# Patient Record
Sex: Female | Born: 1995
Health system: Southern US, Community
[De-identification: ages and names within clinical notes are randomized; demographics above are authoritative.]

## PROBLEM LIST (undated history)

## (undated) DIAGNOSIS — Q0700 Arnold-Chiari syndrome without spina bifida or hydrocephalus: Secondary | ICD-10-CM

## (undated) DIAGNOSIS — D649 Anemia, unspecified: Secondary | ICD-10-CM

## (undated) DIAGNOSIS — E876 Hypokalemia: Secondary | ICD-10-CM

## (undated) DIAGNOSIS — G43909 Migraine, unspecified, not intractable, without status migrainosus: Secondary | ICD-10-CM

## (undated) DIAGNOSIS — Z8489 Family history of other specified conditions: Secondary | ICD-10-CM

## (undated) DIAGNOSIS — K219 Gastro-esophageal reflux disease without esophagitis: Secondary | ICD-10-CM

## (undated) DIAGNOSIS — I959 Hypotension, unspecified: Secondary | ICD-10-CM

## (undated) DIAGNOSIS — F411 Generalized anxiety disorder: Secondary | ICD-10-CM

## (undated) DIAGNOSIS — F429 Obsessive-compulsive disorder, unspecified: Secondary | ICD-10-CM

## (undated) DIAGNOSIS — F901 Attention-deficit hyperactivity disorder, predominantly hyperactive type: Secondary | ICD-10-CM

## (undated) DIAGNOSIS — F950 Transient tic disorder: Secondary | ICD-10-CM

## (undated) DIAGNOSIS — R55 Syncope and collapse: Secondary | ICD-10-CM

## (undated) DIAGNOSIS — J45909 Unspecified asthma, uncomplicated: Secondary | ICD-10-CM

## (undated) DIAGNOSIS — G935 Compression of brain: Secondary | ICD-10-CM

## (undated) HISTORY — DX: Attention-deficit hyperactivity disorder, predominantly hyperactive type: F90.1

## (undated) HISTORY — PX: TUBAL LIGATION: SHX77

## (undated) HISTORY — PX: TONSILLECTOMY: SUR1361

## (undated) HISTORY — PX: WISDOM TOOTH EXTRACTION: SHX21

## (undated) HISTORY — DX: Compression of brain: G93.5

## (undated) HISTORY — DX: Anemia, unspecified: D64.9

## (undated) HISTORY — DX: Hypotension, unspecified: I95.9

## (undated) HISTORY — PX: SINUS EXPLORATION: SHX5214

---
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
## 2015-07-31 ENCOUNTER — Ambulatory Visit (INDEPENDENT_AMBULATORY_CARE_PROVIDER_SITE_OTHER): Payer: BLUE CROSS/BLUE SHIELD | Admitting: Psychology

## 2015-07-31 DIAGNOSIS — F410 Panic disorder [episodic paroxysmal anxiety] without agoraphobia: Secondary | ICD-10-CM | POA: Diagnosis not present

## 2015-07-31 DIAGNOSIS — F411 Generalized anxiety disorder: Secondary | ICD-10-CM | POA: Diagnosis not present

## 2015-08-21 ENCOUNTER — Ambulatory Visit (INDEPENDENT_AMBULATORY_CARE_PROVIDER_SITE_OTHER): Payer: BLUE CROSS/BLUE SHIELD | Admitting: Psychology

## 2015-08-21 DIAGNOSIS — F411 Generalized anxiety disorder: Secondary | ICD-10-CM | POA: Diagnosis not present

## 2015-08-21 DIAGNOSIS — F410 Panic disorder [episodic paroxysmal anxiety] without agoraphobia: Secondary | ICD-10-CM

## 2015-09-04 ENCOUNTER — Ambulatory Visit (INDEPENDENT_AMBULATORY_CARE_PROVIDER_SITE_OTHER): Payer: BLUE CROSS/BLUE SHIELD | Admitting: Psychology

## 2015-09-04 DIAGNOSIS — F411 Generalized anxiety disorder: Secondary | ICD-10-CM | POA: Diagnosis not present

## 2015-09-04 DIAGNOSIS — F410 Panic disorder [episodic paroxysmal anxiety] without agoraphobia: Secondary | ICD-10-CM

## 2015-09-09 ENCOUNTER — Ambulatory Visit (INDEPENDENT_AMBULATORY_CARE_PROVIDER_SITE_OTHER): Payer: BLUE CROSS/BLUE SHIELD | Admitting: Psychology

## 2015-09-09 DIAGNOSIS — F410 Panic disorder [episodic paroxysmal anxiety] without agoraphobia: Secondary | ICD-10-CM | POA: Diagnosis not present

## 2015-09-09 DIAGNOSIS — F411 Generalized anxiety disorder: Secondary | ICD-10-CM

## 2015-09-25 ENCOUNTER — Ambulatory Visit: Payer: BLUE CROSS/BLUE SHIELD | Admitting: Psychology

## 2015-10-09 ENCOUNTER — Ambulatory Visit (INDEPENDENT_AMBULATORY_CARE_PROVIDER_SITE_OTHER): Payer: BLUE CROSS/BLUE SHIELD | Admitting: Psychology

## 2015-10-09 DIAGNOSIS — F411 Generalized anxiety disorder: Secondary | ICD-10-CM

## 2015-10-14 ENCOUNTER — Ambulatory Visit (INDEPENDENT_AMBULATORY_CARE_PROVIDER_SITE_OTHER): Payer: BLUE CROSS/BLUE SHIELD | Admitting: Psychology

## 2015-10-14 DIAGNOSIS — F411 Generalized anxiety disorder: Secondary | ICD-10-CM | POA: Diagnosis not present

## 2015-10-14 DIAGNOSIS — F8082 Social pragmatic communication disorder: Secondary | ICD-10-CM | POA: Diagnosis not present

## 2015-10-16 ENCOUNTER — Ambulatory Visit (INDEPENDENT_AMBULATORY_CARE_PROVIDER_SITE_OTHER): Payer: BLUE CROSS/BLUE SHIELD | Admitting: Psychology

## 2015-10-16 DIAGNOSIS — F422 Mixed obsessional thoughts and acts: Secondary | ICD-10-CM

## 2015-10-16 DIAGNOSIS — F8082 Social pragmatic communication disorder: Secondary | ICD-10-CM

## 2015-10-30 ENCOUNTER — Ambulatory Visit (INDEPENDENT_AMBULATORY_CARE_PROVIDER_SITE_OTHER): Payer: BLUE CROSS/BLUE SHIELD | Admitting: Psychology

## 2015-10-30 DIAGNOSIS — F8082 Social pragmatic communication disorder: Secondary | ICD-10-CM

## 2015-10-30 DIAGNOSIS — F422 Mixed obsessional thoughts and acts: Secondary | ICD-10-CM

## 2015-11-11 ENCOUNTER — Ambulatory Visit (INDEPENDENT_AMBULATORY_CARE_PROVIDER_SITE_OTHER): Payer: BLUE CROSS/BLUE SHIELD | Admitting: Psychology

## 2015-11-11 DIAGNOSIS — F410 Panic disorder [episodic paroxysmal anxiety] without agoraphobia: Secondary | ICD-10-CM

## 2015-11-11 DIAGNOSIS — F8082 Social pragmatic communication disorder: Secondary | ICD-10-CM

## 2015-11-11 DIAGNOSIS — F422 Mixed obsessional thoughts and acts: Secondary | ICD-10-CM | POA: Diagnosis not present

## 2015-12-16 ENCOUNTER — Ambulatory Visit (INDEPENDENT_AMBULATORY_CARE_PROVIDER_SITE_OTHER): Payer: BLUE CROSS/BLUE SHIELD | Admitting: Psychology

## 2015-12-16 DIAGNOSIS — F422 Mixed obsessional thoughts and acts: Secondary | ICD-10-CM

## 2015-12-16 DIAGNOSIS — F840 Autistic disorder: Secondary | ICD-10-CM | POA: Diagnosis not present

## 2015-12-17 DIAGNOSIS — F950 Transient tic disorder: Secondary | ICD-10-CM | POA: Insufficient documentation

## 2015-12-17 DIAGNOSIS — K589 Irritable bowel syndrome without diarrhea: Secondary | ICD-10-CM | POA: Insufficient documentation

## 2015-12-17 DIAGNOSIS — F3281 Premenstrual dysphoric disorder: Secondary | ICD-10-CM | POA: Insufficient documentation

## 2015-12-17 DIAGNOSIS — L900 Lichen sclerosus et atrophicus: Secondary | ICD-10-CM | POA: Insufficient documentation

## 2016-02-24 ENCOUNTER — Ambulatory Visit (INDEPENDENT_AMBULATORY_CARE_PROVIDER_SITE_OTHER): Payer: BLUE CROSS/BLUE SHIELD | Admitting: Psychology

## 2016-02-24 DIAGNOSIS — F422 Mixed obsessional thoughts and acts: Secondary | ICD-10-CM | POA: Diagnosis not present

## 2016-02-24 DIAGNOSIS — F840 Autistic disorder: Secondary | ICD-10-CM

## 2016-03-09 ENCOUNTER — Ambulatory Visit (INDEPENDENT_AMBULATORY_CARE_PROVIDER_SITE_OTHER): Payer: BLUE CROSS/BLUE SHIELD | Admitting: Psychology

## 2016-03-09 DIAGNOSIS — F422 Mixed obsessional thoughts and acts: Secondary | ICD-10-CM

## 2016-03-09 DIAGNOSIS — F840 Autistic disorder: Secondary | ICD-10-CM | POA: Diagnosis not present

## 2017-07-06 DIAGNOSIS — Q0700 Arnold-Chiari syndrome without spina bifida or hydrocephalus: Secondary | ICD-10-CM | POA: Insufficient documentation

## 2018-07-04 DIAGNOSIS — J189 Pneumonia, unspecified organism: Secondary | ICD-10-CM

## 2018-07-04 HISTORY — DX: Pneumonia, unspecified organism: J18.9

## 2018-07-12 DIAGNOSIS — Z531 Procedure and treatment not carried out because of patient's decision for reasons of belief and group pressure: Secondary | ICD-10-CM | POA: Insufficient documentation

## 2018-07-12 DIAGNOSIS — IMO0001 Reserved for inherently not codable concepts without codable children: Secondary | ICD-10-CM | POA: Insufficient documentation

## 2018-07-13 DIAGNOSIS — Z9851 Tubal ligation status: Secondary | ICD-10-CM | POA: Insufficient documentation

## 2018-07-13 HISTORY — PX: SALPINGECTOMY: SHX328

## 2018-12-05 ENCOUNTER — Other Ambulatory Visit: Payer: Self-pay

## 2018-12-05 ENCOUNTER — Other Ambulatory Visit: Payer: BLUE CROSS/BLUE SHIELD

## 2018-12-05 DIAGNOSIS — Z20822 Contact with and (suspected) exposure to covid-19: Secondary | ICD-10-CM

## 2018-12-07 LAB — NOVEL CORONAVIRUS, NAA: SARS-CoV-2, NAA: NOT DETECTED

## 2018-12-24 ENCOUNTER — Telehealth (INDEPENDENT_AMBULATORY_CARE_PROVIDER_SITE_OTHER): Payer: BC Managed Care – PPO | Admitting: Neurology

## 2018-12-24 ENCOUNTER — Other Ambulatory Visit: Payer: Self-pay

## 2018-12-24 ENCOUNTER — Encounter: Payer: Self-pay | Admitting: Neurology

## 2018-12-24 ENCOUNTER — Encounter

## 2018-12-24 VITALS — BP 122/75 | HR 70 | Ht 59.0 in | Wt 125.0 lb

## 2018-12-24 DIAGNOSIS — G935 Compression of brain: Secondary | ICD-10-CM

## 2018-12-24 DIAGNOSIS — R404 Transient alteration of awareness: Secondary | ICD-10-CM

## 2018-12-24 DIAGNOSIS — R4189 Other symptoms and signs involving cognitive functions and awareness: Secondary | ICD-10-CM

## 2018-12-24 DIAGNOSIS — R42 Dizziness and giddiness: Secondary | ICD-10-CM

## 2018-12-24 NOTE — Addendum Note (Signed)
Addended by: Jake Seats on: 12/24/2018 02:55 PM   Modules accepted: Orders

## 2018-12-24 NOTE — Progress Notes (Signed)
Virtual Visit via Video Note The purpose of this virtual visit is to provide medical care while limiting exposure to the novel coronavirus.    Consent was obtained for video visit:  Yes.   Answered questions that patient had about telehealth interaction:  Yes.   I discussed the limitations, risks, security and privacy concerns of performing an evaluation and management service by telemedicine. I also discussed with the patient that there may be a patient responsible charge related to this service. The patient expressed understanding and agreed to proceed.  Pt location: Home Physician Location: office Name of referring provider:  Fortino Sic, * I connected with Laurine Blazer at patients initiation/request on 12/24/2018 at 10:30 AM EDT by video enabled telemedicine application and verified that I am speaking with the correct person using two identifiers. Pt MRN:  [id number] Pt DOB:  06/18/1996 Video Participants:  Laurine Blazer;  Gwenevere Abbot (husband)   History of Present Illness:  This is a 23 year old right-handed woman with a history of migraines, Chiari malformation, OCD, depression, anxiety, presenting for evaluation of several neurological symptoms. She reports daily headaches, near syncope, tinnitus, dizziness, memory problems/confusion, swallowing and breathing changes. She reports a history of migraines when younger, she took Topamax for a time. She started having a different type of headache in October 2019, initially occurring every other week, then worsening for the past 2-3 months where she has a headache daily or several times a day. She reports stabbing and throbbing in the back of her head, as well as pressure behind her eyes. She feels the frontal headaches are related to her sinuses/allergies, she feels congested. She feels dizzy a lot, which amplifies the occipital headaches. Headaches last 1-3 hours, she is sensitive to lights, bright sunlight makes it worse. She  has rare nausea. She occasionally takes 1-2 Tylenol one a wee which seems to help, but she tries to avoid OTC medications due to prior history of ulcer. She has noticed microphones, loud electronic noises, sometimes even her own voice, tends to trigger headaches, she feels like her voice is vibrating inside her head. She has a history of passing out and has seen a neurologist in 2018, she was told brain MRI was normal except for a side note of a Chiari malformation, she was told she was hyperventilating and not realizing it, and diagnosed with panic attacks. Her husband reports 3 or 4 episodes where she comes very close to passing out, she zones out and is not all there, not focusing. The first time it happened, they her BP and blood sugar were normal when checked. He tries to get her to sit and she would feel sleepy right after. It takes her 10-15 minutes before she starts coming back to baseline. She has milder episodes once a week where she spaces out and a lot of her symptoms tend to get worse, she would have more weakness in her arms, pain in her shoulders, and lack of equilibrium. She is reporting a lot of dizziness with a spinning sensation, she has vertigo when moving too fast or bending down, turning her head to talk to someone makes her miserable. She feels like her eyes are moving fast or focusing/unfocusing quickly with blurred vision. She had started working at [employer] but had to quit because of the dizziness and worsened symptoms with the cleaning agents. She started noticing more trouble swallowing, she would randomly choke "on nothing" and have to gasp for air. One time she felt  very faint like she did not have enough air and her chest was burning, then realized "oh I'm not breathing." It took her 45 seconds to start breathing again. They feel breathing/swallowing symptoms are worse at night when supine, her husband has not noticed any apneic episodes or gasping/choking when asleep. She has  been having discomfort in her shoulders and upper arms, but now she is having neck pain and pain in these areas. She is noticing more weakness, she has a hard time lifting her arms up to do her hair. She is worried the back of her head itches all the times, sometimes her entire body. She has noticed hot flashes where her temperature would quickly rise. She has high-pitched tinnitus in both ears. A psychiatrist in the past told her something was wrong with her autonomic nervous system, with low BP and pulse rate. She states her pulse rarely goes above 60 bpm even when she is really stressed. She has been more clumsy recently, running into things causing her to fall. She broke her little toe in the bathroom in the Fall of 2019 for no clear reason.   With all these symptoms, she has also noticed memory problems and confusion. She reports being told by her neuropsychologist around 2 years ago that she has anxiety-induced dementia, however she recently got married 3 months ago and has moved out of her home where there was a lot of stress, reporting that there is significantly less stress and still her memory his worsening. She has noticed her speech is changing, when she feels faint, she cannot get herself to talk, she knows what she wants to say but cannot get the words out. Over the past few weeks, she has noticed issues using her hands to sew or test, described as "almost like my hands have a stutter," with a lag between her brain and hands. She stops the activity and is able to do it in a few hours. She is a Personal assistant"grammar nazi" but recently would make typos which is unusual for her. She states she does not have dyslexia but would replace letters or words, saying a different word instead. She was in a small store recently and got lost, she did not know why she was down an aisle or what she wanted, she could not process the idea of how to find her husband and started to cry and panic until her husband found her. She  continues to see her psychiatrist every 4 months and therapist once a week for OCD, however she requested discontinuation of her psychiatric medications and instead changed her lifestyle, which helped her significantly. She feels rested in the mornings, however her husband states she is really restless at night. She has nightmares every night. No family history of similar symptoms. Her maternal grandmother had dementia. Her sister has endometriosis and hot flashes. She had a normal birth and early development.  There is no history of febrile convulsions, CNS infections such as meningitis/encephalitis, significant traumatic brain injury, neurosurgical procedures, or family history of seizures.   PAST MEDICAL HISTORY: Past Medical History:  Diagnosis Date   ADHD    Anemia    Anxiety    Depression    Hypotension     PAST SURGICAL HISTORY: Past Surgical History:  Procedure Laterality Date   SINUS EXPLORATION     TUBAL LIGATION     WISDOM TOOTH EXTRACTION      MEDICATIONS: Outpatient Encounter Medications as of 12/24/2018  Medication Sig   fluticasone (FLONASE)  50 MCG/ACT nasal spray Place into the nose.   ibuprofen (ADVIL) 800 MG tablet Take by mouth.   levocetirizine (XYZAL) 5 MG tablet Take 5 mg by mouth daily.                            No facility-administered encounter medications on file as of 12/24/2018.     ALLERGIES: Allergies  Allergen Reactions   Garlic Nausea And Vomiting and Other (See Comments)   Lactose Diarrhea and Nausea And Vomiting   Mosquito (Diagnostic) Swelling   Olanzapine Other (See Comments)    Leg spasms Leg spasms Muscle spasms    Hydrocodone Other (See Comments) and Nausea And Vomiting    Nausea and vomiting Nausea and vomiting     FAMILY HISTORY: Family History  Problem Relation Age of Onset   Dementia Maternal Grandmother     Observations/Objective:   Vitals:   12/24/18 1016  BP: 122/75  Pulse: 70  Weight:  125 lb (56.7 kg)  Height: 4\' 11"  (1.499 m)   GEN:  The patient appears stated age and is in NAD.  Neurological examination: Patient is awake, alert, oriented x 3. No aphasia or dysarthria. Intact fluency and comprehension. Remote and recent memory intact. Able to name and repeat. Cranial nerves: Extraocular movements intact with no nystagmus. She reports a pressure in her eyes and dizziness with EOM testing ("spinning and floor is tipping") No facial asymmetry. Motor: moves all extremities symmetrically, at least anti-gravity x 4. No incoordination on finger to nose testing. Gait: narrow-based and steady, able to tandem walk adequately. Positive sway with Romberg test.  Montreal Cognitive Assessment  12/24/2018  Visuospatial/ Executive (0/5) 5  Naming (0/3) 3  Attention: Read list of digits (0/2) 2  Attention: Read list of letters (0/1) 1  Attention: Serial 7 subtraction starting at 100 (0/3) 3  Language: Repeat phrase (0/2) 2  Language : Fluency (0/1) 0  Abstraction (0/2) 2  Delayed Recall (0/5) 4  Orientation (0/6) 6  Total 28  Adjusted Score (based on education) 29    Assessment and Plan:   This is a 23 year old right-handed woman with a history of migraines, Chiari malformation, OCD, depression, anxiety, presenting for evaluation of several symptoms including worsening headaches, dizziness, near syncope with report of zoning out followed by drowsiness, tinnitus, neck pain with upper extremity weakness, confusion. Her neurological exam (limited by nature of video) is non-focal with note of report of vertigo with eye movement testing, +sway with eyes closed. Etiology of symptoms are unclear, unable to have a unifying diagnosis for her multitude of symptoms. She has been told she has a "mild Chiari malformation" and has read up on Chiari malformation concerned this is causing all her symptoms. We discussed that some of her symptoms may be seen with this but would not explain other symptoms,  MRI brain with and without contrast and MRI cervical spine with and without contrast will be ordered to assess for underlying structural abnormality. A 1-hour EEG will be ordered for the zoning out episodes, if normal, a 72-hour EEG will be done to further classify her symptoms. She will be referred for vestibular therapy for the vertigo with eye/head movements. Follow-up after tests, she knows to call for any changes.   Follow Up Instructions:    -I discussed the assessment and treatment plan with the patient. The patient was provided an opportunity to ask questions and all were answered. The patient  agreed with the plan and demonstrated an understanding of the instructions.   The patient was advised to call back or seek an in-person evaluation if the symptoms worsen or if the condition fails to improve as anticipated.    Van ClinesKaren M Tris Howell, MD

## 2018-12-26 ENCOUNTER — Other Ambulatory Visit: Payer: Self-pay

## 2018-12-26 ENCOUNTER — Ambulatory Visit (INDEPENDENT_AMBULATORY_CARE_PROVIDER_SITE_OTHER): Payer: BC Managed Care – PPO | Admitting: Neurology

## 2018-12-26 DIAGNOSIS — R404 Transient alteration of awareness: Secondary | ICD-10-CM

## 2019-01-03 ENCOUNTER — Other Ambulatory Visit: Payer: Self-pay

## 2019-01-03 DIAGNOSIS — R4189 Other symptoms and signs involving cognitive functions and awareness: Secondary | ICD-10-CM

## 2019-01-03 DIAGNOSIS — R404 Transient alteration of awareness: Secondary | ICD-10-CM

## 2019-01-03 DIAGNOSIS — R42 Dizziness and giddiness: Secondary | ICD-10-CM

## 2019-01-03 NOTE — Procedures (Signed)
ELECTROENCEPHALOGRAM REPORT  Date of Study: 12/26/2018  Patient's Name: Claudia [name] MRN: [id number] Date of Birth: 1996-03-22  Referring Provider: Dr. Ellouise Newer  Clinical History: This is a 23 year old woman with dizziness, near syncope with report of zoning out followed by drowsiness. EEG for classification.  Medications: Advil, Xyzal, Flonase  Technical Summary: A multichannel digital 1-hour EEG recording measured by the international 10-20 system with electrodes applied with paste and impedances below 5000 ohms performed in our laboratory with EKG monitoring in an awake and drowsy patient.  Hyperventilation was not performed. Photic stimulation was performed.  The digital EEG was referentially recorded, reformatted, and digitally filtered in a variety of bipolar and referential montages for optimal display.    Description: The patient is awake and drowsy during the recording.  During maximal wakefulness, there is a symmetric, medium voltage 9 Hz posterior dominant rhythm that attenuates with eye opening.  The record is symmetric.  During drowsiness, there is an increase in theta slowing of the background. Sleep was not captured. Photic stimulation did not elicit any abnormalities. There is T6 electrode artifact throughout the study. There were no epileptiform discharges or electrographic seizures seen.    EKG lead was unremarkable.  Impression: This 1-hour awake and drowsy EEG is normal.    Clinical Correlation: A normal EEG does not exclude a clinical diagnosis of epilepsy.  If further clinical questions remain, prolonged EEG may be helpful.  Clinical correlation is advised.   Ellouise Newer, M.D.

## 2019-01-15 ENCOUNTER — Other Ambulatory Visit: Payer: BC Managed Care – PPO

## 2019-01-16 ENCOUNTER — Ambulatory Visit
Admission: RE | Admit: 2019-01-16 | Discharge: 2019-01-16 | Disposition: A | Discharge status: Home / Self Care | Payer: BC Managed Care – PPO | Source: Ambulatory Visit | Attending: Neurology | Admitting: Neurology

## 2019-01-16 DIAGNOSIS — R4189 Other symptoms and signs involving cognitive functions and awareness: Secondary | ICD-10-CM

## 2019-01-16 DIAGNOSIS — G935 Compression of brain: Secondary | ICD-10-CM

## 2019-01-16 DIAGNOSIS — R404 Transient alteration of awareness: Secondary | ICD-10-CM

## 2019-01-16 MED ORDER — GADOBENATE DIMEGLUMINE 529 MG/ML IV SOLN
11.0000 mL | Freq: Once | INTRAVENOUS | Status: AC | PRN
  Administered 2019-01-16: 11:00:00 11 mL via INTRAVENOUS

## 2019-01-21 ENCOUNTER — Other Ambulatory Visit: Payer: Self-pay

## 2019-01-21 ENCOUNTER — Ambulatory Visit (INDEPENDENT_AMBULATORY_CARE_PROVIDER_SITE_OTHER): Payer: BC Managed Care – PPO | Admitting: Neurology

## 2019-01-21 DIAGNOSIS — R404 Transient alteration of awareness: Secondary | ICD-10-CM

## 2019-01-21 DIAGNOSIS — R4189 Other symptoms and signs involving cognitive functions and awareness: Secondary | ICD-10-CM | POA: Diagnosis not present

## 2019-02-05 ENCOUNTER — Telehealth: Payer: Self-pay | Admitting: Neurology

## 2019-02-05 NOTE — Telephone Encounter (Signed)
Pls schedule f/u for 8/20 at 11am, thanks!

## 2019-02-05 NOTE — Telephone Encounter (Signed)
Caller left msg with after hours about patient had called him reporting dizziness, tired, emotional and slurred speech. Reports that "for bad episodes" this is normal for her. Recently had EEG and is wanting results. They are concerned patient is having "micro seizures". Has had episodes of vision loss and blurred vision. Caller states that provider is aware of these Grandfather. Caller reports SX are about the same and is more emotional. RN on call instructed them to call her PCP.

## 2019-02-05 NOTE — Telephone Encounter (Signed)
Not sure if the episodes are seizures.   Episodes last for a few minutes. May be up to 5.   Really tired afterwards  No convulsions.  Has muscle weakness.  Social alcohol use not excessive. Unsure about dehydration.   Informed that EEG results were not back yet.

## 2019-02-05 NOTE — Telephone Encounter (Signed)
Pt informed that both the EEG and MRI were normal. She states that she wore a heart monitor about two years ago ( she cant remember where, but will call back when she finds out). She could only wear the monitor for 15 days due to a severe allergic rxn to the adhesive from the monitor..  Dr. Delice Lesch,  I assume the 8/20 time slot was filled by the time I could put the pt in it. Is 8/21 at 10:30 ok?  I think it is a 60 minute slot though.

## 2019-02-05 NOTE — Telephone Encounter (Signed)
Pls let her know that the brain wave test was normal, no seizure activity seen. With MRI brain and EEG normal, would do a 2-week holter monitor to check for heart issues as potential cause of her symptoms. Migraine variants can also cause odd symptoms. I have an opening on 8/20 at 11am if she can do virtual visit then to discuss next steps, but would do a 2-week holter monitor as well. Pls order if she is agreeable, thanks

## 2019-02-13 NOTE — Procedures (Signed)
ELECTROENCEPHALOGRAM REPORT  Dates of Recording: 01/21/2019 8:23AM to 01/24/2019 3:54AM  Patient's Name: Claudia [name] MRN: [id number] Date of Birth: [birth year]  Referring Provider: Dr. Ellouise Newer  Procedure: 67-hour ambulatory video EEG  History: This is a 23 year old woman with recurrent episodes of zoning out, confusion, weakness. EEG for classification.  Medications: Advil, Flonase, Xyzal  Technical Summary: This is a 67-hour multichannel digital video EEG recording measured by the international 10-20 system with electrodes applied with paste and impedances below 5000 ohms performed as portable with EKG monitoring.  The digital EEG was referentially recorded, reformatted, and digitally filtered in a variety of bipolar and referential montages for optimal display.    DESCRIPTION OF RECORDING: During maximal wakefulness, the background activity consisted of a symmetric 10 Hz posterior dominant rhythm which was reactive to eye opening.  There were no epileptiform discharges or focal slowing seen in wakefulness.  During the recording, the patient progresses through wakefulness, drowsiness, and Stage 2 sleep.  Again, there were no epileptiform discharges seen.  Events: On 7/20 at  1900 hours, she has left left/foot pain. Electrographically, there were no EEG or EKG changes seen.  On 7/20 at 1918 hours, she looked down and felt lightheaded/weak, sleepy. Electrographically, there were no EEG or EKG changes seen.  On 7/20 at 2045 hours, she has right hand pain, worse at 2100 hours. Electrographically, there were no EEG or EKG changes seen.   On 7/21 at 0900 hours, she has right hand pain. Electrographically, there were no EEG or EKG changes seen.;  On 7/21 at 1820 hours, she has right hand pain, weakness, lack of control on right side. Electrographically, there were no EEG or EKG changes seen. She notices right hand swelling at 1825 hours.  On 7/21 at 2032 hours, she has a hot flash and  feels lightheaded, incoherent, sleepy. Electrographically, there were no EEG or EKG changes seen.  On 7/21 at 2045 hours, she has a hot flash and feels shortness of breath, confusion, could not find words at 2050 hours. Electrographically, there were no EEG or EKG changes seen.  On 7/22 at at 2150 hours, she has an itchy neck spot, then felt suddenly very tired/sleepy at 2155 hours. Electrographically, there were no EEG or EKG changes seen.  There were no electrographic seizures seen.  EKG lead was unremarkable.  IMPRESSION: This 67-hour ambulatory video EEG study is normal.    CLINICAL CORRELATION: Episodes of feeling lightheaded, sleepy, confused/incoherent, lack of control on right side, pain, did not show epileptiform correlate,indicating these are non-epileptic. If further clinical questions remain, inpatient video EEG monitoring may be helpful.   Ellouise Newer, M.D.

## 2019-02-21 ENCOUNTER — Telehealth (INDEPENDENT_AMBULATORY_CARE_PROVIDER_SITE_OTHER): Payer: BC Managed Care – PPO | Admitting: Neurology

## 2019-02-21 ENCOUNTER — Encounter: Payer: Self-pay | Admitting: Neurology

## 2019-02-21 ENCOUNTER — Other Ambulatory Visit: Payer: Self-pay

## 2019-02-21 VITALS — Ht 59.0 in | Wt 125.0 lb

## 2019-02-21 DIAGNOSIS — R42 Dizziness and giddiness: Secondary | ICD-10-CM

## 2019-02-21 DIAGNOSIS — R4189 Other symptoms and signs involving cognitive functions and awareness: Secondary | ICD-10-CM | POA: Diagnosis not present

## 2019-02-21 DIAGNOSIS — G43109 Migraine with aura, not intractable, without status migrainosus: Secondary | ICD-10-CM

## 2019-02-21 DIAGNOSIS — G935 Compression of brain: Secondary | ICD-10-CM

## 2019-02-21 DIAGNOSIS — R404 Transient alteration of awareness: Secondary | ICD-10-CM

## 2019-02-21 MED ORDER — TOPIRAMATE 25 MG PO TABS: ORAL_TABLET | ORAL | 6 refills | Status: DC

## 2019-02-21 NOTE — Progress Notes (Signed)
Virtual Visit via Video Note The purpose of this virtual visit is to provide medical care while limiting exposure to the novel coronavirus.    Consent was obtained for video visit:  Yes.   Answered questions that patient had about telehealth interaction:  Yes.   I discussed the limitations, risks, security and privacy concerns of performing an evaluation and management service by telemedicine. I also discussed with the patient that there may be a patient responsible charge related to this service. The patient expressed understanding and agreed to proceed.  Pt location: Home Physician Location: office Name of referring provider:  Fortino Sic, * I connected with Claudia [name] at patients initiation/request on 02/21/2019 at  1:00 PM EDT by video enabled telemedicine application and verified that I am speaking with the correct person using two identifiers. Pt MRN:  [id number] Pt DOB:  1996-02-14 Video Participants:  Claudia [name];  Gwenevere Abbot (husband)   History of Present Illness:  The patient was seen as a virtual video visit on 02/21/2019. Her husband was present to provide additional information. We discussed results of tests. I personally reviewed MRI brain with and without contrast which did not show any acute changes. Cerebellar tonsils only extend 2-73mm through the foramen magnum, within range of normal, MRI cervical spine normal. She had a 67-hour ambulatory EEG which was normal, episodes of feeling lightheaded, sleepy, confused/incoherent, lack of control on right side, pain, did not show epileptiform correlate. She asks about her prior MRI brain done in 2018, report from [facility] indicates that the cerebellar peduncles extend slightly below the foramen magnum bilaterally, more on the left, by 3 or 35mm. I discussed the slight variations with MRIs, as well as the improvement in MRI technology since 2018, with most recent imaging showing no evidence of Chiari I malformation.   She states she is a little better because she is taking it easy a little more. When she feels her symptoms coming on, she does not push it. The headaches however have not improved, they are more frequent and she is taking pain medication now. She feels dizzy a lot and has not seen Vestibular therapy. BP has been normal, there have been a few times she had readings of 127/70 which is high for her, usually she runs at 100/57 or 60. She recalls in the past BP would go down to 70/30. She wore a 15-day heart monitor for syncope which was normal (she was severely allergic to the adhesive). She had seen a neurologist and was diagnosed with a panic disorder, "I was told I was hyperventilating and not realizing it." She still feels near syncopal at times and sits down. The last syncopal episode was 3 years ago. Her memory problems are as bad as ever, she still gets confused. Her husband notes this is not quite as much. She is not having as much of the intense episodes because she feels she recognizes them more, but definitely has problems with word recollection. She has difficulty focusing her eyes and uses a magnifying glass to read. She reports being diagnosed with migraines at age 6 and took Topamax for 4 years, recalling it was very helpful. She is hesitant to take a daily medication stating she was on heavy medications for psychiatric issues in the past and weaned herself off. She states most of her psychiatric symptoms are in remission. She has motor tics with hard blinking because she feels she is not focusing.    History on Initial Assessment 12/24/2018: This  is a 23 year old right-handed woman with a history of migraines, Chiari malformation, OCD, depression, anxiety, presenting for evaluation of several neurological symptoms. She reports daily headaches, near syncope, tinnitus, dizziness, memory problems/confusion, swallowing and breathing changes. She reports a history of migraines when younger, she took  Topamax for a time. She started having a different type of headache in October 2019, initially occurring every other week, then worsening for the past 2-3 months where she has a headache daily or several times a day. She reports stabbing and throbbing in the back of her head, as well as pressure behind her eyes. She feels the frontal headaches are related to her sinuses/allergies, she feels congested. She feels dizzy a lot, which amplifies the occipital headaches. Headaches last 1-3 hours, she is sensitive to lights, bright sunlight makes it worse. She has rare nausea. She occasionally takes 1-2 Tylenol one a wee which seems to help, but she tries to avoid OTC medications due to prior history of ulcer. She has noticed microphones, loud electronic noises, sometimes even her own voice, tends to trigger headaches, she feels like her voice is vibrating inside her head. She has a history of passing out and has seen a neurologist in 2018, she was told brain MRI was normal except for a side note of a Chiari malformation, she was told she was hyperventilating and not realizing it, and diagnosed with panic attacks. Her husband reports 3 or 4 episodes where she comes very close to passing out, she zones out and is not all there, not focusing. The first time it happened, they her BP and blood sugar were normal when checked. He tries to get her to sit and she would feel sleepy right after. It takes her 10-15 minutes before she starts coming back to baseline. She has milder episodes once a week where she spaces out and a lot of her symptoms tend to get worse, she would have more weakness in her arms, pain in her shoulders, and lack of equilibrium. She is reporting a lot of dizziness with a spinning sensation, she has vertigo when moving too fast or bending down, turning her head to talk to someone makes her miserable. She feels like her eyes are moving fast or focusing/unfocusing quickly with blurred vision. 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 She started noticing more trouble swallowing, she would randomly choke "on nothing" and have to gasp for air. One time she felt very faint like she did not have enough air and her chest was burning, then realized "oh I'm not breathing." It took her 45 seconds to start breathing again. They feel breathing/swallowing symptoms are worse at night when supine, her husband has not noticed any apneic episodes or gasping/choking when asleep. She has been having discomfort in her shoulders and upper arms, but now she is having neck pain and pain in these areas. She is noticing more weakness, she has a hard time lifting her arms up to do her hair. She is worried the back of her head itches all the times, sometimes her entire body. She has noticed hot flashes where her temperature would quickly rise. She has high-pitched tinnitus in both ears. A psychiatrist in the past told her something was wrong with her autonomic nervous system, with low BP and pulse rate. She states her pulse rarely goes above 60 bpm even when she is really stressed. She has been  more clumsy recently, running into things causing her to fall. She broke her little toe in the bathroom in the Fall of 2019 for no clear reason.   With all these symptoms, she has also noticed memory problems and confusion. She reports being told by her neuropsychologist around 2 years ago that she has anxiety-induced dementia, however she recently got married 3 months ago and has moved out of her home where there was a lot of stress, reporting that there is significantly less stress and still her memory his worsening. She has noticed her speech is changing, when she feels faint, she cannot get herself to talk, she knows what she wants to say but cannot get the words out. Over the past few weeks, she has noticed issues using her hands to sew or test, described as  "almost like my hands have a stutter," with a lag between her brain and hands. She stops the activity and is able to do it in a few hours. She is a Personal assistant"grammar nazi" but recently would make typos which is unusual for her. She states she does not have dyslexia but would replace letters or words, saying a different word instead. She was in a small store recently and got lost, she did not know why she was down an aisle or what she wanted, she could not process the idea of how to find her husband and started to cry and panic until her husband found her. She continues to see her psychiatrist every 4 months and therapist once a week for OCD, however she requested discontinuation of her psychiatric medications and instead changed her lifestyle, which helped her significantly. She feels rested in the mornings, however her husband states she is really restless at night. She has nightmares every night. No family history of similar symptoms. Her maternal grandmother had dementia. Her sister has endometriosis and hot flashes. She had a normal birth and early development.  There is no history of febrile convulsions, CNS infections such as meningitis/encephalitis, significant traumatic brain injury, neurosurgical procedures, or family history of seizures.     Current Outpatient Medications on File Prior to Visit  Medication Sig Dispense Refill  . fluticasone (FLONASE) 50 MCG/ACT nasal spray Place into the nose.    . levocetirizine (XYZAL) 5 MG tablet Take 5 mg by mouth daily.     . Multiple Vitamin (MULTIVITAMIN) tablet Take by mouth.     No current facility-administered medications on file prior to visit.      Observations/Objective:   Vitals:   02/21/19 1242  Weight: 125 lb (56.7 kg)  Height: 4\' 11"  (1.499 m)   GEN:  The patient appears stated age and is in NAD.  Neurological examination: Patient is awake, alert, oriented x 3. No aphasia or dysarthria. Intact fluency and comprehension. Remote and recent  memory intact. Able to name and repeat. Cranial nerves: Extraocular movements intact with no nystagmus. No facial asymmetry. Motor: moves all extremities symmetrically, at least anti-gravity x 4. No incoordination on finger to nose testing. Gait: narrow-based and steady, able to tandem walk adequately. Negative Romberg test.   Assessment and Plan:   This is a 23 yo RH woman with a history of migraines, OCD, depression, anxiety, who presented for various  ymptoms including worsening headaches, dizziness, near syncope with report of zoning out followed by drowsiness, tinnitus, neck pain with upper extremity weakness, confusion. She was concerned about these symptoms being caused by Chiari malformation (reported on prior MRI in 2018), however repeat MRI  brain showed cerebellar tonsils only extend 2-693mm through the foramen magnum, within range of normal, MRI cervical spine normal. Brain MRI normal. She had a 3-day EEG which was also normal. Findings were discussed at length with the patient and her husband. I do believe there may be a psychological component to her symptoms, however complicated migraines can have similar presentation. We discussed treatment of migraines to see if this helps her overall. She is hesitant but agreeable to starting low dose Topiramate 25mg  qhs, side effects discussed. We may uptitrate as tolerated. She continues to report dizziness and will be referred for vestibular therapy. She also continues to report memory changes, Neuropsychological testing will be helpful to assess cognitive complaints as well as her psychological profile. Follow-up after tests.    Follow Up Instructions:   -I discussed the assessment and treatment plan with the patient. The patient was provided an opportunity to ask questions and all were answered. The patient agreed with the plan and demonstrated an understanding of the instructions.   The patient was advised to call back or seek an in-person evaluation  if the symptoms worsen or if the condition fails to improve as anticipated.    Van ClinesKaren M Wasim Hurlbut, MD

## 2019-02-22 ENCOUNTER — Ambulatory Visit: Payer: BC Managed Care – PPO | Admitting: Neurology

## 2019-03-04 ENCOUNTER — Other Ambulatory Visit: Payer: Self-pay

## 2019-03-04 DIAGNOSIS — R4189 Other symptoms and signs involving cognitive functions and awareness: Secondary | ICD-10-CM

## 2019-03-04 DIAGNOSIS — R42 Dizziness and giddiness: Secondary | ICD-10-CM

## 2019-03-20 ENCOUNTER — Ambulatory Visit: Payer: BC Managed Care – PPO | Attending: Neurology | Admitting: Rehabilitative and Restorative Service Providers"

## 2019-03-20 ENCOUNTER — Other Ambulatory Visit: Payer: Self-pay

## 2019-03-20 DIAGNOSIS — R2689 Other abnormalities of gait and mobility: Secondary | ICD-10-CM | POA: Diagnosis present

## 2019-03-20 DIAGNOSIS — R42 Dizziness and giddiness: Secondary | ICD-10-CM | POA: Diagnosis present

## 2019-03-20 NOTE — Therapy (Signed)
[facility] [address] [place], [place], [address] Phone: [phone]   Fax:  [phone]  Physical Therapy Evaluation  Patient Details  Name: Claudia [name] MRN: [id number] Date of Birth: 03/16/1996 Referring Provider (PT): Patrcia DollyKaren Aquino, MD   Encounter Date: 03/20/2019  PT End of Session - 03/20/19 1516    Visit Number  1    Number of Visits  8    Date for PT Re-Evaluation  05/19/19    Authorization Type  BCBS and medicaid    PT Start Time  1020    PT Stop Time  1100    PT Time Calculation (min)  40 min    Activity Tolerance  Patient tolerated treatment well;Other (comment)   modified to reduce intensity due to h/o migraines   Behavior During Therapy  [facility] for tasks assessed/performed       Past Medical History:  Diagnosis Date  . ADHD   . Anemia   . Anxiety   . Chiari malformation type I (HCC)   . Depression   . Hypotension     Past Surgical History:  Procedure Laterality Date  . SINUS EXPLORATION    . TUBAL LIGATION    . WISDOM TOOTH EXTRACTION      There were no vitals filed for this visit.   Subjective Assessment - 03/20/19 1023    Subjective  The patient has faint, blacking out sensations intermittently worse with bending and return to standing or quick head movements. Symptoms occur daily and last x minutes.  She denies true spinning and feels "out of it" so it's hard to explain.  She gets migraine symptoms of "aura", clumsiness, dyscoordinated.  "I've struggled with  low blood pressure my whole life."  She also notes daily migraines described as "bad ones where I have a bad taste in my mouth before the migraine."  Migraines last hours and she c/o noise sensitivity, light sensitivity, jittery sensation.  She gets blurred vision with migraines and uses magnifiers when reading on screen.  She denies hearing changes, has tinnitus bilateral L worse than R.   Symptoms have worsened over the last 5 months.     Pertinent History  history of migraines, OCD, depression, anxiety    Patient Stated Goals  Move around more without getting vertigo, nausea "misery combo".    Currently in Pain?  No/denies         [facility] PT Assessment - 03/20/19 1030      Assessment   Medical Diagnosis  dizziness    Referring Provider (PT)  Patrcia DollyKaren Aquino, MD    Prior Therapy  none      Precautions   Precautions  None      Restrictions   Weight Bearing Restrictions  No      Balance Screen   Has the patient fallen in the past 6 months  No    Has the patient had a decrease in activity level because of a fear of falling?   No    Is the patient reluctant to leave their home because of a fear of falling?   No      Home Environment   Living Environment  Private residence    Living Arrangements  Spouse/significant other      Prior Function   Level of Independence  Independent    Vocation  Part time employment    Vocation Requirements  quit her job due to symptoms.    Leisure  reports during "really  bad" migraines, she can't verbalize thoughts, and can't move her arms well           Vestibular Assessment - 03/20/19 1037      Vestibular Assessment   General Observation  No dizziness at rest.  Has a hard time transferring laundry and bending or fast movements      Symptom Behavior   Subjective history of current problem  Worsening symtpoms in the past 5 months    Type of Dizziness   --   faint and blacking out sensation   Frequency of Dizziness  daily    Duration of Dizziness  minutes or hours    Symptom Nature  Motion provoked;Variable    Aggravating Factors  Turning head quickly    Relieving Factors  Head stationary;Lying supine    Progression of Symptoms  Worse    History of similar episodes  sewing also brings on symtpoms      Oculomotor Exam   Oculomotor Alignment  Abnormal   L eye hypertropia   Ocular ROM  WFLs    Spontaneous  Absent    Gaze-induced   Absent    Smooth Pursuits  --    challenging to track up to down in R and L visual field   Saccades  Intact      Vestibulo-Ocular Reflex   VOR 1 Head Only (x 1 viewing)  6 reps provokes a fuzzy sensation; hard to focus on target    VOR Cancellation  Normal   mild dizziness "not too bad, creeping in."     Positional Sensitivities   Head Turning x 5  Moderate dizziness   feels like carsickness (gets motion sick easily)   Head Nodding x 5  Moderate dizziness   provokes nausea         Objective measurements completed on examination: See above findings.      [facility] Adult PT Treatment/Exercise - 03/20/19 1518      Neuro Re-ed    Neuro Re-ed Details   corner balance exercise with eyes closed      Vestibular Treatment/Exercise - 03/20/19 1517      Vestibular Treatment/Exercise   Vestibular Treatment Provided  Gaze    Gaze Exercises  X1 Viewing Horizontal      X1 Viewing Horizontal   Foot Position  standing    Comments  provided at low reps due to migraines and recommended begin small and we can increase as tolerated            PT Education - 03/20/19 1515    Education Details  HEP for gaze and balance with eyes closed    Person(s) Educated  Patient    Methods  Explanation;Demonstration;Handout    Comprehension  Verbalized understanding;Returned demonstration       PT Short Term Goals - 03/20/19 1519      PT SHORT TERM GOAL #1   Title  The patient will be indep with HEP for gaze adaptation, balance and habituation.    Time  4    Period  Weeks    Target Date  04/19/19      PT SHORT TERM GOAL #2   Title  The patient will tolerate gaze x 1 x 20 seconds to demo improved motion tolerance.    Time  4    Period  Weeks    Target Date  04/19/19        PT Long Term Goals - 03/20/19 1520      PT LONG TERM  GOAL #1   Title  The patient will be indep with progression of HEP.    Time  8    Period  Weeks    Target Date  05/19/19      PT LONG TERM GOAL #2   Title  The patient will perform  horizontal head motion x 5 reps with dizziness < or equal to 2/10.    Baseline  rated 6/10 "moderate" (used word for description to score in motion sensitivity)    Time  8    Period  Weeks    Target Date  05/19/19      PT LONG TERM GOAL #3   Title  The patient will perform vertical head motion x 5 reps iwth dizziness < or equal to 2/10.    Baseline  rated 6/10 "moderate" (used word for description to score in motion sensitivity)    Time  8    Period  Weeks    Target Date  05/19/19      PT LONG TERM GOAL #4   Title  The patient will return to home walking program.    Time  8    Period  Weeks    Target Date  05/19/19      PT LONG TERM GOAL #5   Title  The patient will tolerate gaze x 1 adaptation x 60 seconds.    Time  8    Period  Weeks    Target Date  05/19/19             Plan - 03/20/19 1523    Clinical Impression Statement  The patient is a 23 yo female presenting to OP physical therapy with motion sensitivity with horizontal, vertical head motion, dec'd tolerance to gaze adaptation and decreased balance. She is being treated for complex migraines and was unable to tolerate recent medication.  PT to progress to tolerance monitoring symptoms and working through habituation activities slowly.    Personal Factors and Comorbidities  Comorbidity 1;Comorbidity 2;Comorbidity 3+    Comorbidities  migraines, anxiety, depression.    Examination-Activity Limitations  Locomotion Level;Stairs;Squat    Examination-Participation Restrictions  Cleaning;Community Activity    Stability/Clinical Decision Making  Evolving/Moderate complexity    Clinical Decision Making  Moderate    Rehab Potential  Good    PT Frequency  1x / week    PT Duration  8 weeks    PT Treatment/Interventions  ADLs/Self Care Home Management;Neuromuscular re-education;Patient/family education;Vestibular;Therapeutic activities;Therapeutic exercise;Balance training;Functional mobility training;Gait training;Stair  training    PT Next Visit Plan  check gaze and balance exercise, add habituation head motion, work on [employer] and progress to tolerance.    Consulted and Agree with Plan of Care  Patient;Family member/caregiver    Family Member Consulted  spouse present       Patient will benefit from skilled therapeutic intervention in order to improve the following deficits and impairments:  Abnormal gait, Dizziness, Decreased balance  Visit Diagnosis: Other abnormalities of gait and mobility  Dizziness and giddiness     Problem List Patient Active Problem List   Diagnosis Date Noted  . Status post tubal ligation 07/13/2018  . Refusal of blood transfusions as patient is Jehovah's Witness 07/12/2018  . Patient is Jehovah's Witness 07/10/2018  . Arnold-Chiari malformation ([place]) 07/06/2017  . Adjustment disorder with anxiety 04/06/2017  . Anxiety 12/17/2015  . Autistic disorder 12/17/2015  . Irritable bowel syndrome without diarrhea 12/17/2015  . Lichen sclerosus et atrophicus 12/17/2015  . Premenstrual dysphoric  disorder 12/17/2015  . Transient tic disorder 12/17/2015  . Adjustment reaction with prolonged depressive reaction 05/07/2013  . Chronic ethmoidal sinusitis 05/07/2013  . Depressive disorder 05/07/2013  . Malaise and fatigue 05/07/2013  . Migraine 05/07/2013  . Hx of neurosis 04/25/2013  . Hypokalemia 04/25/2013  . Routine history and physical examination of adult 04/25/2013  . Abnormal electrocardiogram 04/24/2013  . Sinus tachycardia 04/24/2013  . Syncope and collapse 04/24/2013  . Obsessive-compulsive disorder, unspecified 01/27/2012    Naveen Lorusso , PT 03/20/2019, 3:27 PM   [facility] [address] [place], [place], [address] Phone: [phone]   Fax:  [phone]  Name: Claudia [name] MRN: [id number] Date of Birth: 02/26/1996

## 2019-03-20 NOTE — Patient Instructions (Signed)
Access Code: [email or web address]  URL: [email or web address]  Date: 03/20/2019  Prepared by: Rudell Cobb   Program Notes  Perform these exercises as you can tolerate. They may increase symptoms slightly, but symptoms should settle within 15 minutes of doing exercises.   Exercises Standing Gaze Stabilization with Head Rotation - 5-10 reps - 1 sets - 2-3x daily - 7x weekly Wide Stance with Eyes Closed - 3 reps - 1 sets - 15 seconds hold - 2x daily - 7x weekly

## 2019-04-04 ENCOUNTER — Encounter: Payer: Self-pay | Admitting: Psychology

## 2019-04-04 ENCOUNTER — Ambulatory Visit: Payer: BC Managed Care – PPO | Admitting: Psychology

## 2019-04-04 ENCOUNTER — Ambulatory Visit (INDEPENDENT_AMBULATORY_CARE_PROVIDER_SITE_OTHER): Payer: BC Managed Care – PPO | Admitting: Psychology

## 2019-04-04 ENCOUNTER — Other Ambulatory Visit: Payer: Self-pay

## 2019-04-04 DIAGNOSIS — F411 Generalized anxiety disorder: Secondary | ICD-10-CM

## 2019-04-04 DIAGNOSIS — R41840 Attention and concentration deficit: Secondary | ICD-10-CM | POA: Diagnosis not present

## 2019-04-04 DIAGNOSIS — F901 Attention-deficit hyperactivity disorder, predominantly hyperactive type: Secondary | ICD-10-CM

## 2019-04-04 NOTE — Progress Notes (Signed)
   Neuropsychology Note   Albie Bazin completed 135 minutes of neuropsychological testing with technician, Milana Kidney, B.S., under the supervision of Dr. Christia Reading, Ph.D., licensed neuropsychologist. The patient did not appear overtly distressed by the testing session, per behavioral observation or via self-report to the technician. Rest breaks were offered.    In considering the patient's current level of functioning, level of presumed impairment, nature of symptoms, emotional and behavioral responses during the interview, level of literacy, and observed level of motivation/effort, a battery of tests was selected and communicated to the psychometrician.   Communication between the psychologist and technician was ongoing throughout the testing session and changes were made as deemed necessary based on patient performance on testing, technician observations and additional pertinent factors such as those listed above.   Kea Callan will return within approximately two weeks for an interactive feedback session with Dr. Melvyn Novas at which time his test performances, clinical impressions, and treatment recommendations will be reviewed in detail. The patient understands she can contact our office should she require our assistance before this time.   Full report to follow.  135 minutes were spent face-to-face with patient administering standardized tests and 15 minutes were spent scoring (technician). [CPT [id number], [id number]]

## 2019-04-04 NOTE — Progress Notes (Signed)
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 likely to have ADHD       PROMIS Sleep Disturbance Questionnaire: 15 --- None to Slight   Informed Consent and Coding/Compliance:   Claudia [name] was provided with a verbal description of the nature and purpose of the present neuropsychological evaluation. Also reviewed were the foreseeable risks and/or discomforts and benefits of the procedure, limits of confidentiality, and mandatory reporting requirements of this provider. The patient was given the opportunity to ask questions and receive answers about the evaluation. Oral consent to participate was provided by the patient.   This evaluation was conducted by Newman Nickels, Ph.D., licensed clinical neuropsychologist. Claudia [name] completed a 30-minute clinical interview, billed as one unit [id number], and 150 minutes of cognitive testing, billed as one unit [phone] and four additional units [id number]. Psychometrist Wallace Keller, B.S., assisted Dr. Milbert Coulter with test administration and scoring procedures. As a separate and discrete service, Dr. Milbert Coulter spent a total of 180 minutes in interpretation and report writing, billed as one unit [id number] and  two units 96133.

## 2019-04-05 ENCOUNTER — Encounter: Payer: Self-pay | Admitting: Psychology

## 2019-04-10 ENCOUNTER — Ambulatory Visit: Payer: BC Managed Care – PPO | Attending: Neurology | Admitting: Rehabilitative and Restorative Service Providers"

## 2019-04-10 DIAGNOSIS — R2689 Other abnormalities of gait and mobility: Secondary | ICD-10-CM | POA: Insufficient documentation

## 2019-04-10 DIAGNOSIS — R42 Dizziness and giddiness: Secondary | ICD-10-CM | POA: Insufficient documentation

## 2019-04-11 ENCOUNTER — Ambulatory Visit (INDEPENDENT_AMBULATORY_CARE_PROVIDER_SITE_OTHER): Payer: BC Managed Care – PPO | Admitting: Psychology

## 2019-04-11 ENCOUNTER — Encounter: Payer: Self-pay | Admitting: Psychology

## 2019-04-11 ENCOUNTER — Other Ambulatory Visit: Payer: Self-pay

## 2019-04-11 DIAGNOSIS — F901 Attention-deficit hyperactivity disorder, predominantly hyperactive type: Secondary | ICD-10-CM

## 2019-04-11 NOTE — Progress Notes (Signed)
   NEUROPSYCHOLOGICAL EVALUATION - Feedback [place] Department of Neurology  Reason for Referral:   [name] a 23 y.o. Caucasian female referred by Ellouise Newer, M.D.,to characterize hercurrent cognitive functioning and assist with diagnostic clarity and treatment planning in the context of subjective cognitive decline, a history of ADHD, and a history of several psychiatric comorbidities.  Feedback:   Ms. Claudia [name] completed a comprehensive neuropsychological evaluation on 04/04/2019. Briefly, results suggested neuropsychological functioning largely within appropriate normative ranges. A relative weakness was exhibited across certain aspects of executive functioning, namely cognitive flexibility, working memory, and Product manager. A weakness was also noted across retrieval aspects of a list learning task; however, performance was likely impacted by Ms. Claudia [name] briefly losing focus during task administration. This pattern of performance can be seen in individuals with a history of ADHD. As this condition remains largely untreated currently, this likely represents a primary etiology of Ms. [name] day-to-day cognitive difficulties. Recommendations included continuing with ongoing vestibular therapy pursuits, as well as considerations for actively treating ADHD symptoms above and beyond use of caffeine and physical exercise.   Ms. Claudia [name] was unaccompanied to her virtual feedback appointment. Content of the current session focused on the results of her evaluation and likely etiology of day-to-day deficits. Ms. Claudia [name] was given the opportunity to ask questions and all her questions were answered. she was also encouraged to reach out should additional questions arise.     A total of 15 minutes were spent with Ms. Claudia [name] during the current feedback session.

## 2019-04-17 ENCOUNTER — Ambulatory Visit: Payer: BC Managed Care – PPO | Admitting: Rehabilitative and Restorative Service Providers"

## 2019-04-17 ENCOUNTER — Other Ambulatory Visit: Payer: Self-pay

## 2019-04-17 DIAGNOSIS — R42 Dizziness and giddiness: Secondary | ICD-10-CM | POA: Diagnosis present

## 2019-04-17 DIAGNOSIS — R2689 Other abnormalities of gait and mobility: Secondary | ICD-10-CM | POA: Diagnosis present

## 2019-04-17 NOTE — Patient Instructions (Signed)
Access Code: [email or web address]  URL: [email or web address]  Date: 04/17/2019  Prepared by: Rudell Cobb   Program Notes  Perform these exercises as you can tolerate. They may increase symptoms slightly, but symptoms should settle within 15 minutes of doing exercises.   Exercises Wide Stance with Eyes Closed - 3 reps - 1 sets - 15 seconds hold - 2x daily - 7x weekly Seated Gaze Stabilization with Head Rotation - 1 reps - 1 sets - 10 reps up to 0 seconds hold - 3x daily - 7x weekly Romberg Stance with Head Rotation - 3 reps - 1 sets - 2x daily - 7x weekly Romberg Stance with Head Nods - 3 reps - 1 sets - 2x daily - 7x weekly

## 2019-04-17 NOTE — Therapy (Signed)
[facility] [address] [place], [place], [address] Phone: [phone]   Fax:  [phone]  Physical Therapy Treatment  Patient Details  Name: Claudia [name] MRN: [id number] Date of Birth: 1995-08-20 Referring Provider (PT): Ellouise Newer, MD   Encounter Date: 04/17/2019  PT End of Session - 04/17/19 1803    Visit Number  2    Number of Visits  8    Date for PT Re-Evaluation  05/19/19    Authorization Type  BCBS and medicaid    PT Start Time  1022    PT Stop Time  1100    PT Time Calculation (min)  38 min    Activity Tolerance  Patient tolerated treatment well;Other (comment)   modified to reduce intensity due to h/o migraines   Behavior During Therapy  [facility] for tasks assessed/performed       Past Medical History:  Diagnosis Date  . ADHD, predominantly hyperactive type    Diagnosed in early childhood; approximately age 65  . Anemia   . Arnold-Chiari malformation ([place]) 07/06/2017  . Generalized anxiety disorder   . Hypokalemia 04/25/2013  . Hypotension   . Migraine headaches 05/07/2013   Ongoing since adolescence  . OCD (obsessive compulsive disorder)    Diagnosed in adolescence; approximately age 34  . Syncope and collapse 04/24/2013  . Transient tic disorder 12/17/2015    Past Surgical History:  Procedure Laterality Date  . SINUS EXPLORATION    . TUBAL LIGATION    . WISDOM TOOTH EXTRACTION      There were no vitals filed for this visit.  Subjective Assessment - 04/17/19 1024    Subjective  The patient notes she has not taken the time to do exercises and lost her sheets.  She had bad depression yesterday and a migraine (sometimes they go together), and she notes she had to stay in bed.  No migraine today.    Pertinent History  history of migraines, OCD, depression, anxiety    Patient Stated Goals  Move around more without getting vertigo, nausea "misery combo".    Currently in Pain?  No/denies                        [facility] [place] Adult PT Treatment/Exercise - 04/17/19 1807      Self-Care   Self-Care  Other Self-Care Comments    Other Self-Care Comments   The patient is walking more frequently, depending on migraines.      Neuro Re-ed    Neuro Re-ed Details   Corner balance activities that were dual purposed for balance and for habituation (see head motion up/down and side to side).  Reviewed feet apart standing with eyes closed for HEP.      Vestibular Treatment/Exercise - 04/17/19 1030      Vestibular Treatment/Exercise   Vestibular Treatment Provided  Gaze;Habituation    Habituation Exercises  Standing Horizontal Head Turns;Standing Vertical Head Turns;Comment   also performed seated forward bending x 4 reps   Gaze Exercises  X1 Viewing Horizontal      Standing Horizontal Head Turns   Number of Reps   3    Symptom Description   Divided full rotation into 2 movements where she looks right <>midline x 3 reps, then left<>midline x 3 reps      Standing Vertical Head Turns   Number of Reps   3    Symptom Description   Divided full movement to look up<>midline, down<>midline x  3 reps each      X1 Viewing Horizontal   Foot Position  standing wide base; moved back to sitting to focus on slower head motion    Comments  began x 10 reps at self regulated pace provoking a vertigo sensation;  PT recommended we slow speed down as well as move through smaller ROM.            PT Education - 04/17/19 1803    Education Details  HEP progression    Person(s) Educated  Patient    Methods  Explanation;Demonstration;Handout    Comprehension  Verbalized understanding;Returned demonstration       PT Short Term Goals - 04/17/19 1053      PT SHORT TERM GOAL #1   Title  The patient will be indep with HEP for gaze adaptation, balance and habituation.    Baseline  Patient has HEP, but has not done due to losing first handout.    Time  4    Period  Weeks    Status  Achieved     Target Date  04/19/19      PT SHORT TERM GOAL #2   Title  The patient will tolerate gaze x 1 x 20 seconds to demo improved motion tolerance.    Baseline  Measured and patient can perform 10 reps.    Time  4    Period  Weeks    Status  Not Met    Target Date  04/19/19        PT Long Term Goals - 03/20/19 1520      PT LONG TERM GOAL #1   Title  The patient will be indep with progression of HEP.    Time  8    Period  Weeks    Target Date  05/19/19      PT LONG TERM GOAL #2   Title  The patient will perform horizontal head motion x 5 reps with dizziness < or equal to 2/10.    Baseline  rated 6/10 "moderate" (used word for description to score in motion sensitivity)    Time  8    Period  Weeks    Target Date  05/19/19      PT LONG TERM GOAL #3   Title  The patient will perform vertical head motion x 5 reps iwth dizziness < or equal to 2/10.    Baseline  rated 6/10 "moderate" (used word for description to score in motion sensitivity)    Time  8    Period  Weeks    Target Date  05/19/19      PT LONG TERM GOAL #4   Title  The patient will return to home walking program.    Time  8    Period  Weeks    Target Date  05/19/19      PT LONG TERM GOAL #5   Title  The patient will tolerate gaze x 1 adaptation x 60 seconds.    Time  8    Period  Weeks    Target Date  05/19/19            Plan - 04/17/19 1809    Clinical Impression Statement  The patient has HEP, but has not been performing regularly.  She is walking more on days she is able to tolerate exercise.  Today's session focused on review of adaptation, addition of habituation exercises to home and balance exercise.  PT to continue working to [employer].  PT Treatment/Interventions  ADLs/Self Care Home Management;Neuromuscular re-education;Patient/family education;Vestibular;Therapeutic activities;Therapeutic exercise;Balance training;Functional mobility training;Gait training;Stair training    PT Next Visit Plan  Check  HEP, progress time of gaze, progress home walking.    Consulted and Agree with Plan of Care  Patient;Family member/caregiver       Patient will benefit from skilled therapeutic intervention in order to improve the following deficits and impairments:  Abnormal gait, Dizziness, Decreased balance  Visit Diagnosis: Other abnormalities of gait and mobility  Dizziness and giddiness     Problem List Patient Active Problem List   Diagnosis Date Noted  . ADHD, predominantly hyperactive type 04/04/2019  . Generalized anxiety disorder 04/04/2019  . Refusal of blood transfusions as patient is Jehovah's Witness 07/12/2018  . Arnold-Chiari malformation ([place]) 07/06/2017  . Irritable bowel syndrome without diarrhea 12/17/2015  . Lichen sclerosus et atrophicus 12/17/2015  . Premenstrual dysphoric disorder 12/17/2015  . Transient tic disorder 12/17/2015  . Chronic ethmoidal sinusitis 05/07/2013  . Malaise and fatigue 05/07/2013  . Migraine 05/07/2013  . Hypokalemia 04/25/2013  . Abnormal electrocardiogram 04/24/2013  . Sinus tachycardia 04/24/2013  . Syncope and collapse 04/24/2013  . OCD (obsessive compulsive disorder) 01/27/2012    Jonae Renshaw, PT 04/17/2019, 6:17 PM  [facility] [address] [place], [place], [address] Phone: [phone]   Fax:  [phone]  Name: Jeffifer Rabold MRN: [id number] Date of Birth: [birth year]

## 2019-04-24 ENCOUNTER — Encounter: Payer: BC Managed Care – PPO | Admitting: Rehabilitative and Restorative Service Providers"

## 2019-05-14 ENCOUNTER — Other Ambulatory Visit: Payer: Self-pay

## 2019-05-14 ENCOUNTER — Ambulatory Visit: Payer: BC Managed Care – PPO | Attending: Neurology | Admitting: Physical Therapy

## 2019-05-14 DIAGNOSIS — R2689 Other abnormalities of gait and mobility: Secondary | ICD-10-CM | POA: Insufficient documentation

## 2019-05-14 DIAGNOSIS — R42 Dizziness and giddiness: Secondary | ICD-10-CM | POA: Insufficient documentation

## 2019-05-15 ENCOUNTER — Encounter: Payer: Self-pay | Admitting: Physical Therapy

## 2019-05-15 NOTE — Therapy (Signed)
[facility] [address] [place], [place], [address] Phone: [phone]   Fax:  [phone]  Physical Therapy Treatment  Patient Details  Name: Claudia [name] MRN: [id number] Date of Birth: 1996-05-03 Referring Provider (PT): Ellouise Newer, MD   Encounter Date: 05/14/2019  PT End of Session - 05/15/19 1416    Visit Number  3    Number of Visits  8    Date for PT Re-Evaluation  05/19/19    Authorization Type  BCBS and medicaid    PT Start Time  6440    PT Stop Time  3474    PT Time Calculation (min)  40 min    Activity Tolerance  Patient tolerated treatment well;Other (comment)   rest breaks as needed due to c/o dizziness   Behavior During Therapy  [facility] for tasks assessed/performed       Past Medical History:  Diagnosis Date  . ADHD, predominantly hyperactive type    Diagnosed in early childhood; approximately age 23  . Anemia   . Arnold-Chiari malformation ([place]) 07/06/2017  . Generalized anxiety disorder   . Hypokalemia 04/25/2013  . Hypotension   . Migraine headaches 05/07/2013   Ongoing since adolescence  . OCD (obsessive compulsive disorder)    Diagnosed in adolescence; approximately age 23  . Syncope and collapse 04/24/2013  . Transient tic disorder 12/17/2015    Past Surgical History:  Procedure Laterality Date  . SINUS EXPLORATION    . TUBAL LIGATION    . WISDOM TOOTH EXTRACTION      There were no vitals filed for this visit.  Subjective Assessment - 05/15/19 1409    Subjective  Pt states she has not done the exercises due to having had a sinus infection and migraines since previous PT session (which was 04-17-19)    Pertinent History  history of migraines, OCD, depression, anxiety    Patient Stated Goals  Move around more without getting vertigo, nausea "misery combo".    Currently in Pain?  No/denies                NeuroRe-ed:  Pt performed standing tossing/catching ball straight  up in front for 10 reps; progressed to tossing ball on Rt/Lt sides 5 reps each  Pt performed amb. Tossing and catching ball 30' x 2  Making circles clockwise/counterclockwise, "V" and "t" patterns 30' x 1 rep each direction for improved VOR during gait      Vestibular Treatment/Exercise - 05/15/19 0001      Vestibular Treatment/Exercise   Vestibular Treatment Provided  Gaze    Gaze Exercises  X1 Viewing Horizontal;X1 Viewing Vertical   30 secs in standing each direction        Balance Exercises - 05/15/19 1410      Balance Exercises: Standing   Standing Eyes Opened  Narrow base of support (BOS);Wide (BOA);Head turns;Foam/compliant surface;5 reps    Standing Eyes Closed  Narrow base of support (BOS);Wide (BOA);Head turns;Foam/compliant surface;5 reps    SLS  Eyes open;Solid surface;1 rep   >10 secs on RLE and LLE - no difficulty       PT Education - 05/15/19 1414    Education Details  added standing on pillow (4 positions) for balance; progressed gaze stabilization exercise to 30 secs in standing with horizontal and vertical head turns    Person(s) Educated  Patient    Methods  Explanation;Demonstration   pt was previously given x1 viewing exercise - [employer]   Comprehension  Verbalized  understanding       PT Short Term Goals - 05/15/19 1422      PT SHORT TERM GOAL #1   Title  The patient will be indep with HEP for gaze adaptation, balance and habituation.    Baseline  Patient has HEP, but has not done due to losing first handout.    Time  4    Period  Weeks    Status  Achieved    Target Date  04/19/19      PT SHORT TERM GOAL #2   Title  The patient will tolerate gaze x 1 x 20 seconds to demo improved motion tolerance.    Baseline  Measured and patient can perform 10 reps.    Time  4    Period  Weeks    Status  Not Met    Target Date  04/19/19        PT Long Term Goals - 05/15/19 1422      PT LONG TERM GOAL #1   Title  The patient will be indep with  progression of HEP.    Time  8    Period  Weeks      PT LONG TERM GOAL #2   Title  The patient will perform horizontal head motion x 5 reps with dizziness < or equal to 2/10.    Baseline  rated 6/10 "moderate" (used word for description to score in motion sensitivity)    Time  8    Period  Weeks      PT LONG TERM GOAL #3   Title  The patient will perform vertical head motion x 5 reps iwth dizziness < or equal to 2/10.    Baseline  rated 6/10 "moderate" (used word for description to score in motion sensitivity)    Time  8    Period  Weeks      PT LONG TERM GOAL #4   Title  The patient will return to home walking program.    Time  8    Period  Weeks      PT LONG TERM GOAL #5   Title  The patient will tolerate gaze x 1 adaptation x 60 seconds.    Time  8    Period  Weeks            Plan - 05/15/19 1417    Clinical Impression Statement  Pt reports she has not been doing HEP due to having had sinus infection and also migraines since previous PT session on 04-17-19; pt does report that she was able to jog some since previous session.  HEP progressed to standing position for x1 viewing and added standing on pillows (progressed from floor) for increased vestibular input in maintaining balance - pt tolerated exercises well with short rest breaks taken as needed due to vertigo provoked with activities.    PT Treatment/Interventions  ADLs/Self Care Home Management;Neuromuscular re-education;Patient/family education;Vestibular;Therapeutic activities;Therapeutic exercise;Balance training;Functional mobility training;Gait training;Stair training    PT Next Visit Plan  Check HEP, progress time of gaze, progress home walking.    PT Home Exercise Plan  standing on pillows - EO & EC with feet apart/together; progressed x1 viewing to standing    Consulted and Agree with Plan of Care  Patient       Patient will benefit from skilled therapeutic intervention in order to improve the following  deficits and impairments:  Abnormal gait, Dizziness, Decreased balance  Visit Diagnosis: Other abnormalities of gait and mobility  Dizziness and giddiness     Problem List Patient Active Problem List   Diagnosis Date Noted  . ADHD, predominantly hyperactive type 04/04/2019  . Generalized anxiety disorder 04/04/2019  . Refusal of blood transfusions as patient is Jehovah's Witness 07/12/2018  . Arnold-Chiari malformation ([place]) 07/06/2017  . Irritable bowel syndrome without diarrhea 12/17/2015  . Lichen sclerosus et atrophicus 12/17/2015  . Premenstrual dysphoric disorder 12/17/2015  . Transient tic disorder 12/17/2015  . Chronic ethmoidal sinusitis 05/07/2013  . Malaise and fatigue 05/07/2013  . Migraine 05/07/2013  . Hypokalemia 04/25/2013  . Abnormal electrocardiogram 04/24/2013  . Sinus tachycardia 04/24/2013  . Syncope and collapse 04/24/2013  . OCD (obsessive compulsive disorder) 01/27/2012    Deane Melick, [place], PT 05/15/2019, 2:23 PM  [facility] [address] [place], [place], [address] Phone: [phone]   Fax:  [phone]  Name: Aldea Avis MRN: [id number] Date of Birth: [birth year]

## 2019-05-15 NOTE — Patient Instructions (Signed)
Feet Together (Compliant Surface) Head Motion - Eyes Closed    Stand on compliant surface: pillow________ with feet together. Close eyes and move head slowly, up and down. Repeat _2___ times per session. Do ___1_ sessions per day.   Feet apart/together with EO and EC - with head turns as able to tolerate

## 2019-05-21 ENCOUNTER — Ambulatory Visit: Payer: BC Managed Care – PPO | Admitting: Physical Therapy

## 2019-05-28 ENCOUNTER — Ambulatory Visit: Payer: BC Managed Care – PPO | Admitting: Physical Therapy

## 2019-12-07 ENCOUNTER — Ambulatory Visit: Admission: EM | Admit: 2019-12-07 | Discharge: 2019-12-07 | Discharge status: Absent without leave | Payer: Self-pay

## 2019-12-07 ENCOUNTER — Ambulatory Visit
Admission: EM | Admit: 2019-12-07 | Discharge: 2019-12-07 | Disposition: A | Discharge status: Home / Self Care | Payer: Medicaid Other | Attending: Emergency Medicine | Admitting: Emergency Medicine

## 2019-12-07 DIAGNOSIS — R21 Rash and other nonspecific skin eruption: Secondary | ICD-10-CM | POA: Diagnosis not present

## 2019-12-07 MED ORDER — BURN RELIEF/LIDOCAINE/ALOE 0.5 % EX GEL: CUTANEOUS | 0 refills | Status: DC
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
## 2019-12-07 NOTE — Discharge Instructions (Addendum)
Take medication as prescribed Follow-up with OB/GYN for further evaluation Return or go to ED for worsening of symptoms

## 2019-12-07 NOTE — ED Triage Notes (Signed)
Pt presents with breast itching after tanning bed 2 months ago, pt has some redness around areola

## 2019-12-29 HISTORY — PX: OTHER SURGICAL HISTORY: SHX169

## 2020-01-17 ENCOUNTER — Encounter (HOSPITAL_BASED_OUTPATIENT_CLINIC_OR_DEPARTMENT_OTHER): Payer: Self-pay | Admitting: Obstetrics and Gynecology

## 2020-01-17 ENCOUNTER — Other Ambulatory Visit: Payer: Self-pay

## 2020-01-17 NOTE — Progress Notes (Addendum)
Spoke w/ via phone for pre-op interview---pt Lab needs dos----    Cbc, type and screen urine preg , bmet ekg          Lab results------eeg 01-21-2019 epic COVID test ------01-24-2020 at 1515 Arrive at -------700 am 01-28-2020 NPO after MN NO Solid Food.  Clear liquids from MN until---600 am then npo Medications to take morning of surgery -----albuterol inhaler prn/bring inhaler Diabetic medication -----n/a Patient Special Instructions -----n/a Pre-Op special Istructions -----n/a Patient verbalized understanding of instructions that were given at this phone interview. Patient denies shortness of breath, chest pain, fever, cough a this phone interview.  Anesthesia :hypotension at times, syncopy, pt states she does not have chaori malformation, lov labauer nerulolgy states chaori malformation, pt denies any cardiac S & S or sob at pre op phone call  PCP: Regulatory affairs officer pa Cardiologist :none Chest x-ray :none EKG :none Echo :none Cardiac Cath : none Activity level: able to climb steps without difficulty and does own housework without difficulty Sleep Study/ CPAP :none Fasting Blood Sugar :      / Checks Blood Sugar -- times a day:  n/a Blood Thinner/ Instructions /Last Dose:n/a ASA / Instructions/ Last Dose : n/a   Patient states chaori malformation was a misdiagnosisi and does not have chaorimalformation  Pt has lov labauer neurology 12-24-2018 epic that states chaori malformation eeg 01-21-2019 epic  NO BLOOD PRODUCTS JEHOVAH WITNESS, PT TO BRING CARD DAY OF SURGERY  Pt has new left cartlidge piercing she will remove if has to

## 2020-01-24 ENCOUNTER — Other Ambulatory Visit (HOSPITAL_COMMUNITY)
Admission: RE | Admit: 2020-01-24 | Discharge: 2020-01-24 | Disposition: A | Discharge status: Home / Self Care | Payer: Medicaid Other | Source: Ambulatory Visit | Attending: Obstetrics and Gynecology | Admitting: Obstetrics and Gynecology

## 2020-01-24 DIAGNOSIS — Z01812 Encounter for preprocedural laboratory examination: Secondary | ICD-10-CM | POA: Diagnosis present

## 2020-01-24 DIAGNOSIS — Z20822 Contact with and (suspected) exposure to covid-19: Secondary | ICD-10-CM | POA: Insufficient documentation

## 2020-01-24 LAB — SARS CORONAVIRUS 2 (TAT 6-24 HRS): SARS Coronavirus 2: NEGATIVE

## 2020-01-27 NOTE — H&P (Signed)
Claudia [name] is an 24 y.o. female G0 undergoing wu for IVF cysle thru [facility] given h/oB salpingectomy.  On SIUS pt found to have sychiae at fundus.  D/W pt r/b/a and process of surgery.  Will proceed with resection.    Pertinent Gynecological History G0  H/o B salpingectomy - undergoing w/u for IVF thru [facility] - synchaie found on SIUS  No abn pap No STD  Menstrual History:  Patient's last menstrual period was 01/11/2020.    Past Medical History:  Diagnosis Date  . ADHD, predominantly hyperactive type    Diagnosed in early childhood; approximately age 24  . Anemia   . Asthma    allergy induced  . Chiari malformation type I (HCC)    pt denies 01-16-2020  . Family history of adverse reaction to anesthesia    father had ? questionale anesthesia reaction not sure what yrs ago  . GERD (gastroesophageal reflux disease)    no meds   . Hypokalemia 04/25/2013   resolved  . Hypotension runs low occ 90-40  . Migraine headaches 05/07/2013   Ongoing since adolescence, colpex and vestibular affect balance and speech  . Pneumonia 07/2018   resolved  . Syncope and collapse 04/24/2013   resolved    Past Surgical History:  Procedure Laterality Date  . ivf retrival  12/29/2019   eggs retrived at [place] infertility  . SALPINGECTOMY Bilateral 07/13/2018  . SINUS EXPLORATION  age 24  . TONSILLECTOMY  age 17   and adenoids  . WISDOM TOOTH EXTRACTION  age 24    Family History  Problem Relation Age of Onset  . Dementia Maternal Grandmother   DM  Social History:  reports that she has never smoked. She has never used smokeless tobacco. She reports current alcohol use. She reports previous drug use. married, disabled   Allergies:  Allergies  Allergen Reactions  . Garlic Nausea And Vomiting and Other (See Comments)    Stomach upset  . Lactose Diarrhea and Nausea And Vomiting    Stomach upset  . Mosquito (Diagnostic) Swelling  . Olanzapine Other (See Comments)    Leg  spasms Leg spasms Muscle spasms  With zyprexa   . Adhesive [Tape]     If leaves on blisters swelling and scarring, likes paper tape  . Albumin (Human)     No blood products jehovah witness  . Other     All pain meds, anxiety meds, sedatives cause anxiety and prefers not to take  . Topamax [Topiramate]     Makes paranoid, afraid, suicidal x 3 days  . Valium [Diazepam]     Does not help  . Hydrocodone Nausea And Vomiting and Other (See Comments)    Nausea and vomiting Nausea and vomiting All pain meds n/v , paranoid, anxiety, takes tylenol or ibuprofen     Meds: Albuterol, Singulair, PNV    Review of Systems  Constitutional: Negative.   HENT: Negative.   Eyes: Negative.   Respiratory: Negative.   Cardiovascular: Negative.   Gastrointestinal: Negative.   Genitourinary: Negative.   Musculoskeletal: Negative.   Skin: Negative.   Hematological: Negative.   Psychiatric/Behavioral: Negative.     Height 4\' 11"  (1.499 m), weight 59 kg, last menstrual period 01/11/2020. Physical Exam Constitutional:      Appearance: Normal appearance.  HENT:     Head: Normocephalic and atraumatic.  Cardiovascular:     Rate and Rhythm: Normal rate and regular rhythm.  Pulmonary:     Effort: Pulmonary effort is  normal.     Breath sounds: Normal breath sounds.  Abdominal:     General: Abdomen is flat. Bowel sounds are normal.     Palpations: Abdomen is soft.  Genitourinary:    General: Normal vulva.  Musculoskeletal:        General: Normal range of motion.     Cervical back: Normal range of motion and neck supple.  Skin:    General: Skin is warm and dry.  Neurological:     General: No focal deficit present.     Mental Status: She is alert and oriented to person, place, and time.  Psychiatric:        Mood and Affect: Mood normal.        Behavior: Behavior normal.     No results found for this or any previous visit (from the past 24 hour(s)).  No results  found.  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

## 2020-01-27 NOTE — Anesthesia Preprocedure Evaluation (Addendum)
Anesthesia Evaluation  Patient identified by MRN, date of birth, ID band Patient awake    Reviewed: Allergy & Precautions, NPO status , Patient's Chart, lab work & pertinent test results  History of Anesthesia Complications Negative for: history of anesthetic complications  Airway Mallampati: II  TM Distance: >3 FB Neck ROM: Full    Dental no notable dental hx. (+) Dental Advisory Given   Pulmonary asthma ,    Pulmonary exam normal        Cardiovascular negative cardio ROS Normal cardiovascular exam     Neuro/Psych  Headaches, PSYCHIATRIC DISORDERS Anxiety Depression    GI/Hepatic Neg liver ROS, GERD  ,  Endo/Other  negative endocrine ROS  Renal/GU negative Renal ROS     Musculoskeletal negative musculoskeletal ROS (+)   Abdominal   Peds  Hematology negative hematology ROS (+)   Anesthesia Other Findings   Reproductive/Obstetrics                            Anesthesia Physical Anesthesia Plan  ASA: II  Anesthesia Plan: General   Post-op Pain Management:    Induction: Intravenous  PONV Risk Score and Plan: 3 and Ondansetron, Dexamethasone and Midazolam  Airway Management Planned: LMA  Additional Equipment:   Intra-op Plan:   Post-operative Plan: Extubation in OR  Informed Consent: I have reviewed the patients History and Physical, chart, labs and discussed the procedure including the risks, benefits and alternatives for the proposed anesthesia with the patient or authorized representative who has indicated his/her understanding and acceptance.     Dental advisory given  Plan Discussed with: Anesthesiologist and CRNA  Anesthesia Plan Comments:        Anesthesia Quick Evaluation

## 2020-01-28 ENCOUNTER — Other Ambulatory Visit: Payer: Self-pay

## 2020-01-28 ENCOUNTER — Encounter (HOSPITAL_BASED_OUTPATIENT_CLINIC_OR_DEPARTMENT_OTHER)
Admission: RE | Disposition: A | Discharge status: Home / Self Care | Payer: Self-pay | Source: Home / Self Care | Attending: Obstetrics and Gynecology

## 2020-01-28 ENCOUNTER — Ambulatory Visit (HOSPITAL_BASED_OUTPATIENT_CLINIC_OR_DEPARTMENT_OTHER): Payer: Medicaid Other | Admitting: Anesthesiology

## 2020-01-28 ENCOUNTER — Encounter (HOSPITAL_BASED_OUTPATIENT_CLINIC_OR_DEPARTMENT_OTHER): Payer: Self-pay | Admitting: Obstetrics and Gynecology

## 2020-01-28 ENCOUNTER — Ambulatory Visit (HOSPITAL_BASED_OUTPATIENT_CLINIC_OR_DEPARTMENT_OTHER)
Admission: RE | Admit: 2020-01-28 | Discharge: 2020-01-28 | Disposition: A | Discharge status: Home / Self Care | Payer: Medicaid Other | Attending: Obstetrics and Gynecology | Admitting: Obstetrics and Gynecology

## 2020-01-28 DIAGNOSIS — N856 Intrauterine synechiae: Secondary | ICD-10-CM | POA: Insufficient documentation

## 2020-01-28 DIAGNOSIS — Z98891 History of uterine scar from previous surgery: Secondary | ICD-10-CM

## 2020-01-28 HISTORY — DX: Gastro-esophageal reflux disease without esophagitis: K21.9

## 2020-01-28 HISTORY — DX: History of uterine scar from previous surgery: Z98.891

## 2020-01-28 HISTORY — DX: Family history of other specified conditions: Z84.89

## 2020-01-28 HISTORY — DX: Unspecified asthma, uncomplicated: J45.909

## 2020-01-28 HISTORY — PX: HYSTEROSCOPY WITH D & C: SHX1775

## 2020-01-28 LAB — CBC
HCT: 41.7 % (ref 36.0–46.0)
Hemoglobin: 14.4 g/dL (ref 12.0–15.0)
MCH: 31.2 pg (ref 26.0–34.0)
MCHC: 34.5 g/dL (ref 30.0–36.0)
MCV: 90.3 fL (ref 80.0–100.0)
Platelets: 293 10*3/uL (ref 150–400)
RBC: 4.62 MIL/uL (ref 3.87–5.11)
RDW: 12.1 % (ref 11.5–15.5)
WBC: 5.5 10*3/uL (ref 4.0–10.5)
nRBC: 0 % (ref 0.0–0.2)

## 2020-01-28 LAB — POCT I-STAT, CHEM 8
BUN: 12 mg/dL (ref 6–20)
Calcium, Ion: 1.1 mmol/L — ABNORMAL LOW (ref 1.15–1.40)
Chloride: 104 mmol/L (ref 98–111)
Creatinine, Ser: 0.6 mg/dL (ref 0.44–1.00)
Glucose, Bld: 101 mg/dL — ABNORMAL HIGH (ref 70–99)
HCT: 45 % (ref 36.0–46.0)
Hemoglobin: 15.3 g/dL — ABNORMAL HIGH (ref 12.0–15.0)
Potassium: 4.5 mmol/L (ref 3.5–5.1)
Sodium: 139 mmol/L (ref 135–145)
TCO2: 24 mmol/L (ref 22–32)

## 2020-01-28 LAB — POCT PREGNANCY, URINE: Preg Test, Ur: NEGATIVE

## 2020-01-28 SURGERY — DILATATION AND CURETTAGE /HYSTEROSCOPY
Anesthesia: General | Site: Vagina

## 2020-01-28 MED ORDER — IBUPROFEN 600 MG PO TABS
600.0000 mg | ORAL_TABLET | Freq: Four times a day (QID) | ORAL | 1 refills | Status: AC | PRN
Start: 2020-01-28 — End: ?

## 2020-01-28 MED ORDER — CELECOXIB 200 MG PO CAPS
200.0000 mg | ORAL_CAPSULE | Freq: Once | ORAL | Status: AC
  Administered 2020-01-28: 200 mg via ORAL

## 2020-01-28 MED ORDER — DEXAMETHASONE SODIUM PHOSPHATE 10 MG/ML IJ SOLN
INTRAMUSCULAR | Status: DC | PRN
  Administered 2020-01-28: 10 mg via INTRAVENOUS

## 2020-01-28 MED ORDER — DEXAMETHASONE SODIUM PHOSPHATE 10 MG/ML IJ SOLN
INTRAMUSCULAR | Status: AC
  Filled 2020-01-28: qty 1

## 2020-01-28 MED ORDER — FENTANYL CITRATE (PF) 100 MCG/2ML IJ SOLN
INTRAMUSCULAR | Status: AC
  Filled 2020-01-28: qty 2

## 2020-01-28 MED ORDER — LACTATED RINGERS IV SOLN: INTRAVENOUS | Status: DC

## 2020-01-28 MED ORDER — LIDOCAINE 2% (20 MG/ML) 5 ML SYRINGE
INTRAMUSCULAR | Status: AC
  Filled 2020-01-28: qty 5

## 2020-01-28 MED ORDER — DOXYCYCLINE HYCLATE 100 MG PO TABS: 100.0000 mg | ORAL_TABLET | Freq: Once | ORAL | Status: DC

## 2020-01-28 MED ORDER — ONDANSETRON HCL 4 MG/2ML IJ SOLN
INTRAMUSCULAR | Status: AC
  Filled 2020-01-28: qty 2

## 2020-01-28 MED ORDER — LIDOCAINE HCL 1 % IJ SOLN
INTRAMUSCULAR | Status: DC | PRN
  Administered 2020-01-28: 10 mL

## 2020-01-28 MED ORDER — DOXYCYCLINE HYCLATE 100 MG PO TABS
100.0000 mg | ORAL_TABLET | Freq: Once | ORAL | Status: AC
  Administered 2020-01-28: 100 mg via ORAL

## 2020-01-28 MED ORDER — MIDAZOLAM HCL 5 MG/5ML IJ SOLN
INTRAMUSCULAR | Status: DC | PRN
  Administered 2020-01-28: 2 mg via INTRAVENOUS

## 2020-01-28 MED ORDER — DOXYCYCLINE HYCLATE 100 MG PO TABS
ORAL_TABLET | ORAL | Status: AC
  Filled 2020-01-28: qty 2

## 2020-01-28 MED ORDER — LIDOCAINE 2% (20 MG/ML) 5 ML SYRINGE
INTRAMUSCULAR | Status: DC | PRN
  Administered 2020-01-28: 80 mg via INTRAVENOUS

## 2020-01-28 MED ORDER — ACETAMINOPHEN 500 MG PO TABS
1000.0000 mg | ORAL_TABLET | Freq: Once | ORAL | Status: AC
  Administered 2020-01-28: 1000 mg via ORAL

## 2020-01-28 MED ORDER — ACETAMINOPHEN 500 MG PO TABS
ORAL_TABLET | ORAL | Status: AC
  Filled 2020-01-28: qty 2

## 2020-01-28 MED ORDER — SODIUM CHLORIDE 0.9 % IR SOLN
Status: DC | PRN
  Administered 2020-01-28: 1300 mL

## 2020-01-28 MED ORDER — PROPOFOL 10 MG/ML IV BOLUS
INTRAVENOUS | Status: DC | PRN
  Administered 2020-01-28: 200 mg via INTRAVENOUS

## 2020-01-28 MED ORDER — ONDANSETRON HCL 4 MG/2ML IJ SOLN
INTRAMUSCULAR | Status: DC | PRN
  Administered 2020-01-28: 4 mg via INTRAVENOUS

## 2020-01-28 MED ORDER — CELECOXIB 200 MG PO CAPS
ORAL_CAPSULE | ORAL | Status: AC
  Filled 2020-01-28: qty 1

## 2020-01-28 MED ORDER — FENTANYL CITRATE (PF) 100 MCG/2ML IJ SOLN
INTRAMUSCULAR | Status: DC | PRN
  Administered 2020-01-28: 50 ug via INTRAVENOUS
  Administered 2020-01-28 (×2): 25 ug via INTRAVENOUS

## 2020-01-28 MED ORDER — MIDAZOLAM HCL 2 MG/2ML IJ SOLN
INTRAMUSCULAR | Status: AC
  Filled 2020-01-28: qty 2

## 2020-01-28 SURGICAL SUPPLY — 20 items
BIPOLAR CUTTING LOOP 21FR (ELECTRODE)
CANISTER SUCT 3000ML PPV (MISCELLANEOUS) IMPLANT
CATH ROBINSON RED A/P 16FR (CATHETERS) IMPLANT
COVER WAND RF STERILE (DRAPES) ×3 IMPLANT
DILATOR CANAL MILEX (MISCELLANEOUS) IMPLANT
GAUZE 4X4 16PLY RFD (DISPOSABLE) ×3 IMPLANT
GLOVE BIO SURGEON STRL SZ 6.5 (GLOVE) ×2 IMPLANT
GLOVE BIO SURGEONS STRL SZ 6.5 (GLOVE) ×1
GOWN STRL REUS W/ TWL LRG LVL3 (GOWN DISPOSABLE) ×1 IMPLANT
GOWN STRL REUS W/TWL LRG LVL3 (GOWN DISPOSABLE) ×3
IV NS IRRIG 3000ML ARTHROMATIC (IV SOLUTION) ×3 IMPLANT
KIT PROCEDURE FLUENT (KITS) ×3 IMPLANT
KIT TURNOVER CYSTO (KITS) ×3 IMPLANT
LOOP CUTTING BIPOLAR 21FR (ELECTRODE) IMPLANT
PACK VAGINAL MINOR WOMEN LF (CUSTOM PROCEDURE TRAY) ×3 IMPLANT
PAD OB MATERNITY 4.3X12.25 (PERSONAL CARE ITEMS) ×3 IMPLANT
PAD PREP 24X48 CUFFED NSTRL (MISCELLANEOUS) ×3 IMPLANT
SYR 20ML LL LF (SYRINGE) IMPLANT
TOWEL OR 17X26 10 PK STRL BLUE (TOWEL DISPOSABLE) ×3 IMPLANT
WATER STERILE IRR 500ML POUR (IV SOLUTION) IMPLANT

## 2020-01-28 NOTE — Transfer of Care (Signed)
Immediate Anesthesia Transfer of Care Note  Patient: Claudia [name]  Procedure(s) Performed: DILATATION AND CURETTAGE /HYSTEROSCOPY, resection of scar tissue (N/A Vagina )  Patient Location: PACU  Anesthesia Type:General  Level of Consciousness: awake, alert  and oriented  Airway & Oxygen Therapy: Patient Spontanous Breathing and Patient connected to face mask oxygen  Post-op Assessment: Report given to RN and Post -op Vital signs reviewed and stable  Post vital signs: Reviewed and stable  Last Vitals:  Vitals Value Taken Time  BP 91/48 01/28/20 0937  Temp    Pulse 113 01/28/20 0940  Resp 35 01/28/20 0940  SpO2 100 % 01/28/20 0940  Vitals shown include unvalidated device data.  Last Pain:  Vitals:   01/28/20 0723  TempSrc: Oral  PainSc: 0-No pain         Complications: No complications documented.

## 2020-01-28 NOTE — Brief Op Note (Signed)
01/28/2020  9:41 AM  PATIENT:  Claudia [name]  24 y.o. female  PRE-OPERATIVE DIAGNOSIS:  disorder of uterus, scar tissue  POST-OPERATIVE DIAGNOSIS:  disorder of uterus, scar tissue  PROCEDURE:  Procedure(s): DILATATION AND CURETTAGE /HYSTEROSCOPY, resection of scar tissue (N/A)  SURGEON:  Surgeon(s) and Role:    * Bovard-Stuckert, Hardeep Reetz, MD - Primary  ANESTHESIA:   general by LMA  EBL:  10 mL EBL 10cc, uop voided directly before procedure   BLOOD ADMINISTERED:none  DRAINS: none   LOCAL MEDICATIONS USED:  LIDOCAINE   SPECIMEN:  Source of Specimen:  endometrial curretings  DISPOSITION OF SPECIMEN:  PATHOLOGY  COUNTS:  YES  TOURNIQUET:  * No tourniquets in log *  DICTATION: .Other Dictation: Dictation Number [id number]  PLAN OF CARE: Discharge to home after PACU  PATIENT DISPOSITION:  PACU - hemodynamically stable.   Delay start of Pharmacological VTE agent (>24hrs) due to surgical blood loss or risk of bleeding: not applicable

## 2020-01-28 NOTE — Anesthesia Postprocedure Evaluation (Signed)
Anesthesia Post Note  Patient: Claudia [name]  Procedure(s) Performed: DILATATION AND CURETTAGE /HYSTEROSCOPY, resection of scar tissue (N/A Vagina )     Patient location during evaluation: PACU Anesthesia Type: General Level of consciousness: sedated Pain management: pain level controlled Vital Signs Assessment: post-procedure vital signs reviewed and stable Respiratory status: spontaneous breathing and respiratory function stable Cardiovascular status: stable Postop Assessment: no apparent nausea or vomiting Anesthetic complications: no   No complications documented.  Last Vitals:  Vitals:   01/28/20 1009 01/28/20 1034  BP:  118/81  Pulse: 80 80  Resp: 18 18  Temp:  36.8 C  SpO2: 100% 98%    Last Pain:  Vitals:   01/28/20 1034  TempSrc:   PainSc: 0-No pain                 Aleiah Mohammed DANIEL

## 2020-01-28 NOTE — Interval H&P Note (Signed)
History and Physical Interval Note:  01/28/2020 8:30 AM  Claudia [name]  has presented today for surgery, with the diagnosis of disorder of uterus, scar tissue.  The various methods of treatment have been discussed with the patient and family. After consideration of risks, benefits and other options for treatment, the patient has consented to  Procedure(s): DILATATION AND CURETTAGE /HYSTEROSCOPY, resection of scar tissue (N/A) as a surgical intervention.  The patient's history has been reviewed, patient examined, no change in status, stable for surgery.  I have reviewed the patient's chart and labs.  Questions were answered to the patient's satisfaction.     Ardel Jagger Bovard-Stuckert

## 2020-01-28 NOTE — Op Note (Signed)
[name], [name] MEDICAL RECORD [id number] ACCOUNT [id number] DATE OF BIRTH:14-Jan-1996 FACILITY: [facility] LOCATION: [facility] PHYSICIAN:Mansour Balboa BOVARD-STUCKERT, MD  OPERATIVE REPORT  DATE OF PROCEDURE:  01/28/2020  PREOPERATIVE DIAGNOSIS:  Uterine synechiae at fundus.  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  PROCEDURE:  Hysteroscopy, resection of scarring as well as focused D and C.  SURGEON:  Sherron Monday, MD   ANESTHESIA:  General by LMA.  ESTIMATED BLOOD LOSS:  Approximately 10 mL.  IV FLUIDS:  Per anesthesia.  URINE OUTPUT:  The patient voided directly before procedure.  FINDINGS:  Normal normal-sized uterus sounding to 7 cc.  Uterine cavity with scarring noticed at the right ostia, a band of scar tissue as well as a band of scar tissue at the fundus.  These were first incised with laparoscopic scissors and then removed  with a focused D and C and graspers.  PATHOLOGY:  Endometrial curettings.  COMPLICATIONS:  None.  Local paracervical block with lidocaine was placed.  DESCRIPTION OF PROCEDURE:  After informed consent was reviewed with the patient, she was transported to the operating room and placed on table in supine position.  General anesthesia was induced and found to be adequate.  She was then placed in the  [place] stirrups, prepped and draped in the normal sterile fashion.  An appropriate timeout was performed.  Using an open-sided speculum, her cervix was easily visualized.  A paracervical block was placed with 10 mL of lidocaine.  The anterior lip of  the cervix was grasped with a single-tooth tenaculum.  The uterus was dilated to accommodate the hysteroscope.  The hysteroscope was entered into the uterine cavity revealing normal appearing uterine cavity with scar tissue at the right ostia and fundus.   This was both incised with laparoscopic scissors and then removed with a focused D and C as well as graspers.  The ostia were both visualized.   The patient tolerated the procedure well.  Sponge, lap and needle counts were correct x2 per the operating  staff.  The instruments were removed from her vagina.  She was returned to the supine position and awakened in stable condition.  CN/NUANCE  D:01/28/2020 T:01/28/2020 JOB:[id number]

## 2020-01-28 NOTE — Anesthesia Procedure Notes (Signed)
Procedure Name: LMA Insertion Date/Time: 01/28/2020 9:03 AM Performed by: Pearson Grippe, CRNA Pre-anesthesia Checklist: Patient identified, Emergency Drugs available, Suction available and Patient being monitored Patient Re-evaluated:Patient Re-evaluated prior to induction Oxygen Delivery Method: Circle system utilized Preoxygenation: Pre-oxygenation with 100% oxygen Induction Type: IV induction Ventilation: Mask ventilation without difficulty LMA: LMA inserted LMA Size: 4.0 Number of attempts: 1 Airway Equipment and Method: Bite block Placement Confirmation: positive ETCO2 Tube secured with: Tape Dental Injury: Teeth and Oropharynx as per pre-operative assessment

## 2020-01-28 NOTE — Discharge Instructions (Signed)
  Post Anesthesia Home Care Instructions  Activity: Get plenty of rest for the remainder of the day. A responsible adult should stay with you for 24 hours following the procedure.  For the next 24 hours, DO NOT: -Drive a car -Operate machinery -Drink alcoholic beverages -Take any medication unless instructed by your physician -Make any legal decisions or sign important papers.  Meals: Start with liquid foods such as gelatin or soup. Progress to regular foods as tolerated. Avoid greasy, spicy, heavy foods. If nausea and/or vomiting occur, drink only clear liquids until the nausea and/or vomiting subsides. Call your physician if vomiting continues.  Special Instructions/Symptoms: Your throat may feel dry or sore from the anesthesia or the breathing tube placed in your throat during surgery. If this causes discomfort, gargle with warm salt water. The discomfort should disappear within 24 hours.  If you had a scopolamine patch placed behind your ear for the management of post- operative nausea and/or vomiting:  1. The medication in the patch is effective for 72 hours, after which it should be removed.  Wrap patch in a tissue and discard in the trash. Wash hands thoroughly with soap and water. 2. You may remove the patch earlier than 72 hours if you experience unpleasant side effects which may include dry mouth, dizziness or visual disturbances. 3. Avoid touching the patch. Wash your hands with soap and water after contact with the patch.   DISCHARGE INSTRUCTIONS: D&C / D&E The following instructions have been prepared to help you care for yourself upon your return home.   Personal hygiene: . Use sanitary pads for vaginal drainage, not tampons. . Shower the day after your procedure. . NO tub baths, pools or Jacuzzis for 2-3 weeks. . Wipe front to back after using the bathroom.  Activity and limitations: . Do NOT drive or operate any equipment for 24 hours. The effects of anesthesia are  still present and drowsiness may result. . Do NOT rest in bed all day. . Walking is encouraged. . Walk up and down stairs slowly. . You may resume your normal activity in one to two days or as indicated by your physician.  Sexual activity: NO intercourse for at least 2 weeks after the procedure, or as indicated by your physician.  Diet: Eat a light meal as desired this evening. You may resume your usual diet tomorrow.  Return to work: You may resume your work activities in one to two days or as indicated by your doctor.  What to expect after your surgery: Expect to have vaginal bleeding/discharge for 2-3 days and spotting for up to 10 days. It is not unusual to have soreness for up to 1-2 weeks. You may have a slight burning sensation when you urinate for the first day. Mild cramps may continue for a couple of days. You may have a regular period in 2-6 weeks.  Call your doctor for any of the following: . Excessive vaginal bleeding, saturating and changing one pad every hour. . Inability to urinate 6 hours after discharge from hospital. . Pain not relieved by pain medication. . Fever of 100.4 F or greater. . Unusual vaginal discharge or odor.   Call for an appointment:      

## 2020-01-29 ENCOUNTER — Encounter (HOSPITAL_BASED_OUTPATIENT_CLINIC_OR_DEPARTMENT_OTHER): Payer: Self-pay | Admitting: Obstetrics and Gynecology

## 2020-01-29 LAB — SURGICAL PATHOLOGY

## 2020-02-14 ENCOUNTER — Other Ambulatory Visit (HOSPITAL_COMMUNITY): Payer: Self-pay | Admitting: Obstetrics and Gynecology

## 2020-02-14 DIAGNOSIS — N979 Female infertility, unspecified: Secondary | ICD-10-CM

## 2020-02-14 DIAGNOSIS — N859 Noninflammatory disorder of uterus, unspecified: Secondary | ICD-10-CM

## 2020-02-18 ENCOUNTER — Other Ambulatory Visit: Payer: Self-pay

## 2020-02-18 ENCOUNTER — Ambulatory Visit
Admission: RE | Admit: 2020-02-18 | Discharge: 2020-02-18 | Disposition: A | Discharge status: Home / Self Care | Payer: Medicaid Other | Source: Ambulatory Visit | Attending: Obstetrics and Gynecology | Admitting: Obstetrics and Gynecology

## 2020-02-18 DIAGNOSIS — N859 Noninflammatory disorder of uterus, unspecified: Secondary | ICD-10-CM | POA: Insufficient documentation

## 2020-04-30 ENCOUNTER — Encounter (HOSPITAL_COMMUNITY): Payer: Self-pay | Admitting: Emergency Medicine

## 2020-04-30 ENCOUNTER — Emergency Department (HOSPITAL_COMMUNITY)
Admission: EM | Admit: 2020-04-30 | Discharge: 2020-04-30 | Disposition: A | Discharge status: Home / Self Care | Payer: Medicaid Other | Attending: Emergency Medicine | Admitting: Emergency Medicine

## 2020-04-30 ENCOUNTER — Other Ambulatory Visit: Payer: Self-pay

## 2020-04-30 DIAGNOSIS — O219 Vomiting of pregnancy, unspecified: Secondary | ICD-10-CM | POA: Insufficient documentation

## 2020-04-30 DIAGNOSIS — J45909 Unspecified asthma, uncomplicated: Secondary | ICD-10-CM | POA: Diagnosis not present

## 2020-04-30 DIAGNOSIS — Z3A11 11 weeks gestation of pregnancy: Secondary | ICD-10-CM

## 2020-04-30 DIAGNOSIS — R112 Nausea with vomiting, unspecified: Secondary | ICD-10-CM

## 2020-04-30 MED ORDER — SODIUM CHLORIDE 0.9 % IV BOLUS
1000.0000 mL | Freq: Once | INTRAVENOUS | Status: AC
  Administered 2020-04-30: 1000 mL via INTRAVENOUS

## 2020-04-30 MED ORDER — ONDANSETRON HCL 4 MG/2ML IJ SOLN
4.0000 mg | Freq: Once | INTRAMUSCULAR | Status: AC
  Administered 2020-04-30: 4 mg via INTRAVENOUS
  Filled 2020-04-30: qty 2

## 2020-04-30 NOTE — ED Triage Notes (Signed)
Pt c/o of n/v. Has meds from OB but vomiting meds up. Pt is [redacted] weeks pregnant

## 2020-04-30 NOTE — ED Notes (Signed)
Patient denies pain and is resting comfortably.  

## 2020-04-30 NOTE — ED Notes (Signed)
Pt tolerated drinking sprite and eating saltine crackers well.  No complaints at this time.

## 2020-04-30 NOTE — ED Provider Notes (Signed)
[facility] EMERGENCY DEPARTMENT Provider Note   CSN: [id number] Arrival date & time: 04/30/20  1117    History Chief Complaint  Patient presents with  . Hyperemesis Gravidarum    Claudia [name] is a 24 y.o. female   G1P0  Followed by Charlesetta Ivory recently transferred care from IVF team  [place] yesterday with single IUP, Multiple [place], IVF pregnancy, No fallopian tubes per patient, Per Patient via ObGyn no concern for molar pregnancy  No abd pain, vaginal bleeding, discharge  NBNB emesis since onset of pregnancy, taking Promethazine however has not taken since yesterday, Attempted however with emesis immediately after wards.  Also takes Unisom and B6  No fever, child, hematemesis, CP, SOB, Abd pain, pelvic pain, vaginal bleeding, weakness.  Denies additional aggravating or alleviating factors.  History obtained from patient and past medical history. No interpretor was used.   HPI     Past Medical History:  Diagnosis Date  . ADHD, predominantly hyperactive type    Diagnosed in early childhood; approximately age 75  . Anemia   . Asthma    allergy induced  . Chiari malformation type I ([place])    pt denies 01-16-2020  . Family history of adverse reaction to anesthesia    father had ? questionale anesthesia reaction not sure what yrs ago  . GERD (gastroesophageal reflux disease)    no meds   . Hypokalemia 04/25/2013   resolved  . Hypotension runs low occ 90-40  . Migraine headaches 05/07/2013   Ongoing since adolescence, colpex and vestibular affect balance and speech  . Pneumonia 07/2018   resolved  . Status post hysteroscopic resection of uterine septum 01/28/2020  . Syncope and collapse 04/24/2013   resolved    Patient Active Problem List   Diagnosis Date Noted  . Status post hysteroscopic resection of uterine septum 01/28/2020  . ADHD, predominantly hyperactive type 04/04/2019  . Generalized anxiety disorder 04/04/2019  . Refusal of blood transfusions as  patient is Jehovah's Witness 07/12/2018  . Arnold-Chiari malformation ([place]) 07/06/2017  . Irritable bowel syndrome without diarrhea 12/17/2015  . Lichen sclerosus et atrophicus 12/17/2015  . Premenstrual dysphoric disorder 12/17/2015  . Transient tic disorder 12/17/2015  . Chronic ethmoidal sinusitis 05/07/2013  . Malaise and fatigue 05/07/2013  . Migraine 05/07/2013  . Hypokalemia 04/25/2013  . Abnormal electrocardiogram 04/24/2013  . Sinus tachycardia 04/24/2013  . Syncope and collapse 04/24/2013  . OCD (obsessive compulsive disorder) 01/27/2012    Past Surgical History:  Procedure Laterality Date  . HYSTEROSCOPY WITH D & C N/A 01/28/2020   Procedure: DILATATION AND CURETTAGE /HYSTEROSCOPY, resection of scar tissue;  Surgeon: Janyth Contes, MD;  Location: [place];  Service: Gynecology;  Laterality: N/A;  . ivf retrival  12/29/2019   eggs ret[place] at [place]  . SALPINGECTOMY Bilateral 07/13/2018  . SINUS EXPLORATION  age 13  . TONSILLECTOMY  age 44   and adenoids  . WISDOM TOOTH EXTRACTION  age 67     OB History    Gravida  1   Para      Term      Preterm      AB      Living        SAB      TAB      Ectopic      Multiple      Live Births              Family History  Problem Relation  Age of Onset  . Dementia Maternal Grandmother     Social History   Tobacco Use  . Smoking status: Never Smoker  . Smokeless tobacco: Never Used  Vaping Use  . Vaping Use: Never used  Substance Use Topics  . Alcohol use: Yes    Comment: wine once a week   . Drug use: Not Currently    Home Medications Prior to Admission medications   Medication Sig Start Date End Date Taking? Authorizing Provider  ALBUTEROL IN Inhale 2 puffs into the lungs as needed.    [provider]  Cetrorelix Acetate 0.25 MG KIT Inject into the skin daily.    [provider]  estradiol (ESTRACE) 2 MG tablet Take 2 mg by  mouth 2 (two) times daily.    [provider]  fluticasone (FLONASE) 50 MCG/ACT nasal spray Place into the nose as needed.  11/07/17   [provider]  Follitropin Alfa (GONAL-F) 450 units SOLR Inject as directed daily.    [provider]  ibuprofen (ADVIL) 600 MG tablet Take 1 tablet (600 mg total) by mouth every 6 (six) hours as needed for cramping. 01/28/20   Bovard-Stuckert, Jody, MD  loratadine (CLARITIN) 10 MG tablet Take 10 mg by mouth at bedtime.    [provider]  montelukast (SINGULAIR) 10 MG tablet Take 10 mg by mouth at bedtime.    [provider]  Prenatal Vit-Fe Fumarate-FA (MULTIVITAMIN-PRENATAL) 27-0.8 MG TABS tablet Take 1 tablet by mouth daily at 12 noon.    [provider]    Allergies    Garlic, Lactose, Mosquito (diagnostic), Olanzapine, Adhesive [tape], Albumin (human), Other, Topamax [topiramate], Valium [diazepam], and Hydrocodone  Review of Systems   Review of Systems  Constitutional: Negative.   HENT: Negative.   Respiratory: Negative.   Cardiovascular: Negative.   Gastrointestinal: Negative.   Genitourinary: Negative.   Musculoskeletal: Negative.   Skin: Negative.   Neurological: Negative.   All other systems reviewed and are negative.   Physical Exam Updated Vital Signs BP (!) 153/79   Pulse 87   Temp 98.2 F (36.8 C) (Oral)   Resp 16   Ht '4\' 11"'  (1.499 m)   Wt 63.5 kg   LMP 01/11/2020 (Exact Date)   SpO2 98%   BMI 28.28 kg/m   Physical Exam Vitals and nursing note reviewed.  Constitutional:      General: She is not in acute distress.    Appearance: She is well-developed. She is not ill-appearing, toxic-appearing or diaphoretic.  HENT:     Head: Normocephalic and atraumatic.     Nose: Nose normal.     Mouth/Throat:     Mouth: Mucous membranes are moist.  Eyes:     Pupils: Pupils are equal, round, and reactive to light.  Cardiovascular:     Rate and Rhythm: Normal rate and regular  rhythm.     Pulses: Normal pulses.     Heart sounds: Normal heart sounds.  Pulmonary:     Effort: Pulmonary effort is normal. No respiratory distress.     Breath sounds: Normal breath sounds.  Abdominal:     General: Bowel sounds are normal. There is no distension.     Palpations: Abdomen is soft.     Tenderness: There is no abdominal tenderness. There is no right CVA tenderness, left CVA tenderness, guarding or rebound.     Hernia: No hernia is present.     Comments: FHT 140  Musculoskeletal:  General: No swelling, tenderness, deformity or signs of injury. Normal range of motion.     Cervical back: Normal range of motion and neck supple.     Right lower leg: No edema.     Left lower leg: No edema.  Skin:    General: Skin is warm and dry.     Capillary Refill: Capillary refill takes less than 2 seconds.  Neurological:     General: No focal deficit present.     Mental Status: She is alert and oriented to person, place, and time.    ED Results / Procedures / Treatments   Labs (all labs ordered are listed, but only abnormal results are displayed) Labs Reviewed - No data to display  EKG None  Radiology No results found.  Procedures Procedures (including critical care time)  Medications Ordered in ED Medications  sodium chloride 0.9 % bolus 1,000 mL (0 mLs Intravenous Stopped 04/30/20 1323)  ondansetron (ZOFRAN) injection 4 mg (4 mg Intravenous Given 04/30/20 1235)  sodium chloride 0.9 % bolus 1,000 mL (0 mLs Intravenous Stopped 04/30/20 1440)   ED Course  I have reviewed the triage vital signs and the nursing notes.  Pertinent labs & imaging results that were available during my care of the patient were reviewed by me and considered in my medical decision making (see chart for details).  11w0 day preg. Followed by [facility] OB[facility].  Has had multiple ultrasounds as this is an IVF pregnancy.  No concerns for molar pregnancy, multiple gestations.  Last ultrasound  yesterday which showed singleton IUP.  Has been taking Unisom, B6 and Phenergan throughout her pregnancy.  Felt her symptoms had improved last Saturday so she stopped taking her antiemetics however her emesis returned.  Has been able to keep down some small sips of liquids however no solid food.  She denies any abdominal pain, vaginal bleeding, vaginal discharge.  States she was sent here by OB/GYN for fluids and Zofran.  Tender.  Feel ready.  Heart and lungs clear.  Plan on IV fluids, antiemetics, reassess  Patient reassessed after 1 L of fluids, Zofran.  She is feeling improvement.  Will give additional liter of fluids, p.o. challenge  Patient reassessed.  Has had 2 total liters of fluids.  She is feeling much improvement.  She is able to tolerate p.o. intake without difficulty.  Discussed with patient follow-up closely with OB/GYN.  Return for any worsening symptoms.  The patient has been appropriately medically screened and/or stabilized in the ED. I have low suspicion for any other emergent medical condition which would require further screening, evaluation or treatment in the ED or require inpatient management.  Patient is hemodynamically stable and in no acute distress.  Patient able to ambulate in department prior to ED.  Evaluation does not show acute pathology that would require ongoing or additional emergent interventions while in the emergency department or further inpatient treatment.  I have discussed the diagnosis with the patient and answered all questions.  Pain is been managed while in the emergency department and patient has no further complaints prior to discharge.  Patient is comfortable with plan discussed in room and is stable for discharge at this time.  I have discussed strict return precautions for returning to the emergency department.  Patient was encouraged to follow-up with PCP/specialist refer to at discharge.    MDM Rules/Calculators/A&P  Final  Clinical Impression(s) / ED Diagnoses Final diagnoses:  [redacted] weeks gestation of pregnancy  Non-intractable vomiting with nausea, unspecified vomiting type    Rx / DC Orders ED Discharge Orders    None       Hanah Moultry A, PA-C 04/30/20 1500    Fredia Sorrow, MD 05/01/20 2208

## 2020-04-30 NOTE — Discharge Instructions (Signed)
Follow-up with OB/GYN  Continue to take the promethazine additional antiemetics your OB/GYN is given you.  Return for new worsening symptoms.  Start out with bland diet and then gradually increase to your normal diet as vomiting improves.

## 2020-10-23 ENCOUNTER — Other Ambulatory Visit: Payer: Self-pay

## 2020-10-23 ENCOUNTER — Ambulatory Visit (INDEPENDENT_AMBULATORY_CARE_PROVIDER_SITE_OTHER): Payer: Medicaid Other | Admitting: Pediatrics

## 2020-10-23 DIAGNOSIS — Z7681 Expectant parent(s) prebirth pediatrician visit: Secondary | ICD-10-CM

## 2020-10-23 NOTE — Progress Notes (Signed)
Prenatal counseling for impending newborn done. IVF pregnancy. Mom will deliver at [facility]. Father is on autism spectrum, his brother and father are also on the spectrum. Discussed screening timetable for autism with parents. No pregnancy complications. Plans to breast feed. Vaccine schedule and policy discussed with parents. Parents agree to vaccinate.

## 2020-12-12 ENCOUNTER — Other Ambulatory Visit: Payer: Self-pay

## 2020-12-12 ENCOUNTER — Encounter: Payer: Self-pay | Admitting: Emergency Medicine

## 2020-12-12 ENCOUNTER — Ambulatory Visit
Admission: EM | Admit: 2020-12-12 | Discharge: 2020-12-12 | Disposition: A | Discharge status: Home / Self Care | Payer: Medicaid Other

## 2020-12-12 DIAGNOSIS — B369 Superficial mycosis, unspecified: Secondary | ICD-10-CM | POA: Diagnosis not present

## 2020-12-12 MED ORDER — CLOTRIMAZOLE-BETAMETHASONE 1-0.05 % EX CREA: TOPICAL_CREAM | CUTANEOUS | 0 refills | Status: AC

## 2020-12-12 NOTE — ED Triage Notes (Signed)
Hx of scalp ringworm.  Red area on upper part of neck at hairline.

## 2020-12-12 NOTE — Discharge Instructions (Addendum)
I have sent in lotrisone cream for you to use twice a day as needed  Follow up with this office or with primary care if symptoms are persisting.  Follow up in the ER for high fever, trouble swallowing, trouble breathing, other concerning symptoms.

## 2020-12-12 NOTE — ED Provider Notes (Signed)
[place]   [id number] 12/12/20 Arrival Time: 1211  CC: RASH  SUBJECTIVE:  Claudia [name] is a 25 y.o. female who presents with a skin complaint that began about 3 months ago. Reports ringworm to the nape of the neck. Reports previous symptoms that improve with a prescription cream. Denies precipitating event or trauma.  Denies changes in soaps, detergents, close contacts with similar rash, known trigger or environmental trigger, allergy. Denies medications change or starting a new medication recently. Describes it as erythematous, flaky and itchy.  Has tried OTC lotrimin without relief.  There are no aggravating or alleviating factors. Denies fever, chills, nausea, vomiting, swelling, discharge, oral lesions, SOB, chest pain, abdominal pain, changes in bowel or bladder function.    ROS: As per HPI.  All other pertinent ROS negative.     Past Medical History:  Diagnosis Date   ADHD, predominantly hyperactive type    Diagnosed in early childhood; approximately age 85   Anemia    Asthma    allergy induced   Chiari malformation type I ([place])    pt denies 01-16-2020   Family history of adverse reaction to anesthesia    father had ? questionale anesthesia reaction not sure what yrs ago   GERD (gastroesophageal reflux disease)    no meds    Hypokalemia 04/25/2013   resolved   Hypotension runs low occ 90-40   Migraine headaches 05/07/2013   Ongoing since adolescence, colpex and vestibular affect balance and speech   Pneumonia 07/2018   resolved   Status post hysteroscopic resection of uterine septum 01/28/2020   Syncope and collapse 04/24/2013   resolved   Past Surgical History:  Procedure Laterality Date   HYSTEROSCOPY WITH D & C N/A 01/28/2020   Procedure: DILATATION AND CURETTAGE /HYSTEROSCOPY, resection of scar tissue;  Surgeon: Janyth Contes, MD;  Location: [facility];  Service: Gynecology;  Laterality: N/A;   ivf retrival  12/29/2019   eggs  retrived at [place] [facility]   SALPINGECTOMY Bilateral 07/13/2018   SINUS EXPLORATION  age 52   TONSILLECTOMY  age 34   and adenoids   WISDOM TOOTH EXTRACTION  age 52   Allergies  Allergen Reactions   Garlic Nausea And Vomiting and Other (See Comments)    Stomach upset   Lactose Diarrhea and Nausea And Vomiting    Stomach upset   Mosquito (Diagnostic) Swelling   Olanzapine Other (See Comments)    Leg spasms Leg spasms Muscle spasms  With zyprexa    Adhesive [Tape]     If leaves on blisters swelling and scarring, likes paper tape   Albumin (Human)     No blood products jehovah witness   Other     All pain meds, anxiety meds, sedatives cause anxiety and prefers not to take   Topamax [Topiramate]     Makes paranoid, afraid, suicidal x 3 days   Valium [Diazepam]     Does not help   Hydrocodone Nausea And Vomiting and Other (See Comments)    Nausea and vomiting Nausea and vomiting All pain meds n/v , paranoid, anxiety, takes tylenol or ibuprofen    No current facility-administered medications on file prior to encounter.   Current Outpatient Medications on File Prior to Encounter  Medication Sig Dispense Refill   omeprazole (PRILOSEC) 10 MG capsule Take 10 mg by mouth daily.     ALBUTEROL IN Inhale 2 puffs into the lungs as needed.     Cetrorelix Acetate 0.25 MG  KIT Inject into the skin daily.     estradiol (ESTRACE) 2 MG tablet Take 2 mg by mouth 2 (two) times daily.     fluticasone (FLONASE) 50 MCG/ACT nasal spray Place into the nose as needed.      Follitropin Alfa (GONAL-F) 450 units SOLR Inject as directed daily.     ibuprofen (ADVIL) 600 MG tablet Take 1 tablet (600 mg total) by mouth every 6 (six) hours as needed for cramping. 30 tablet 1   loratadine (CLARITIN) 10 MG tablet Take 10 mg by mouth at bedtime.     montelukast (SINGULAIR) 10 MG tablet Take 10 mg by mouth at bedtime.     Prenatal Vit-Fe Fumarate-FA (MULTIVITAMIN-PRENATAL) 27-0.8 MG TABS  tablet Take 1 tablet by mouth daily at 12 noon.     Social History   Socioeconomic History   Marital status: Married    Spouse name: Not on file   Number of children: 0   Years of education: 12   Highest education level: High school graduate  Occupational History   Not on file  Tobacco Use   Smoking status: Never   Smokeless tobacco: Never  Vaping Use   Vaping Use: Never used  Substance and Sexual Activity   Alcohol use: Yes    Comment: wine once a week    Drug use: Not Currently   Sexual activity: Yes    Partners: Male    Birth control/protection: Surgical  Other Topics Concern   Not on file  Social History Narrative   Right handed       Lives with husband in one story home   Social Determinants of Health   Financial Resource Strain: Not on file  Food Insecurity: Not on file  Transportation Needs: Not on file  Physical Activity: Not on file  Stress: Not on file  Social Connections: Not on file  Intimate Partner Violence: Not on file   Family History  Problem Relation Age of Onset   Dementia Maternal Grandmother     OBJECTIVE: Vitals:   12/12/20 1302  BP: 117/77  Pulse: 98  Resp: 16  Temp: (!) 97.5 F (36.4 C)  TempSrc: Oral  SpO2: 97%    General appearance: alert; no distress Head: NCAT Lungs: clear to auscultation bilaterally Heart: regular rate and rhythm.  Radial pulse 2+ bilaterally Extremities: no edema Skin: warm and dry; erythematous well circumscribed rash to the nape of the neck with central flaking, consistent with fungal rash Psychological: alert and cooperative; normal mood and affect  ASSESSMENT & PLAN:  1. Fungal infection of skin     Meds ordered this encounter  Medications   clotrimazole-betamethasone (LOTRISONE) cream    Sig: Apply to affected area 2 times daily prn    Dispense:  45 g    Refill:  0    Order Specific Question:   Supervising Provider    Answer:   Chase Picket [[id number]]   Prescribed lotrisone  cream Take as prescribed and to completion Avoid hot showers/ baths Moisturize skin daily  Follow up with PCP if symptoms persists Return or go to the ER if you have any new or worsening symptoms such as fever, chills, nausea, vomiting, redness, swelling, discharge, if symptoms do not improve with medications  Reviewed expectations re: course of current medical issues. Questions answered. Outlined signs and symptoms indicating need for more acute intervention. Patient verbalized understanding. After Visit Summary given.    Faustino Congress, NP 12/12/20 1406

## 2021-06-04 IMAGING — US US PELVIS COMPLETE WITH TRANSVAGINAL
1 series · 15 of 25 positions shown · non-contrast
Comparison: None

CLINICAL DATA: Disorder of uterus, uterine septum removal January 2020, scar tissue, hysteroscopy and prior D and C, BILATERAL
salpingectomy; LMP 02/14/2020 until present

EXAM:
TRANSABDOMINAL AND TRANSVAGINAL ULTRASOUND OF PELVIS
TECHNIQUE: Both transabdominal and transvaginal ultrasound examinations of the
pelvis were performed. Transabdominal technique was performed for
global imaging of the pelvis including uterus, ovaries, adnexal
regions, and pelvic cul-de-sac. It was necessary to proceed with
endovaginal exam following the transabdominal exam to visualize the
lower uterine segment and endometrium.

[Series 1: us pelvis complete with transvaginal · 15 of 89 slices shown]
[im 1/89]
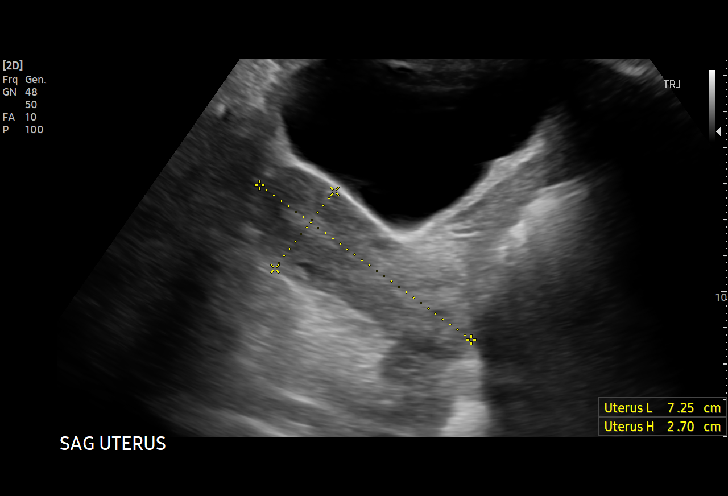
[im 8/89]
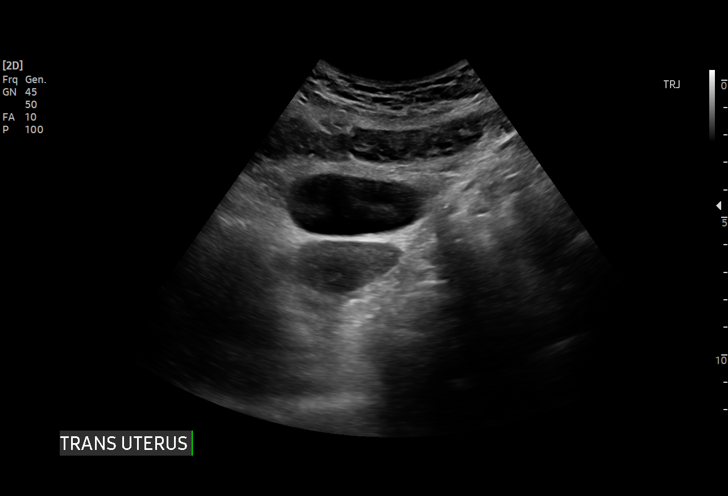
[im 15/89]
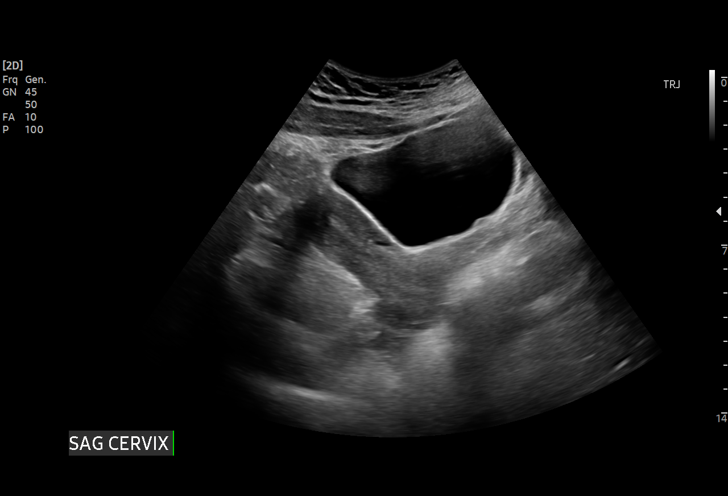
[im 19/89]
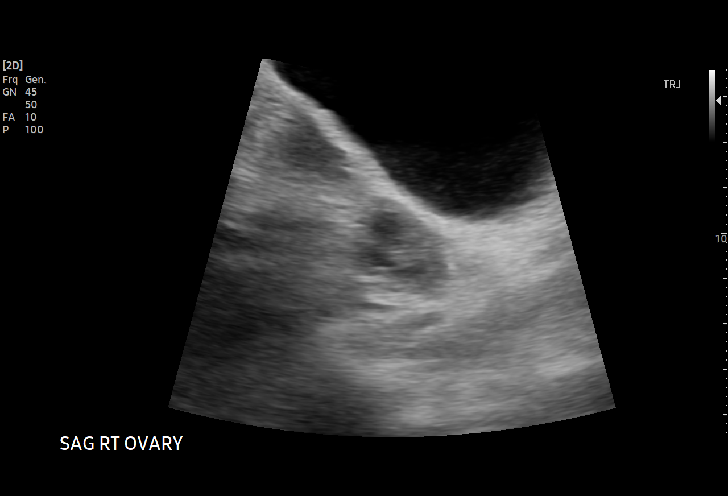
[im 26/89]
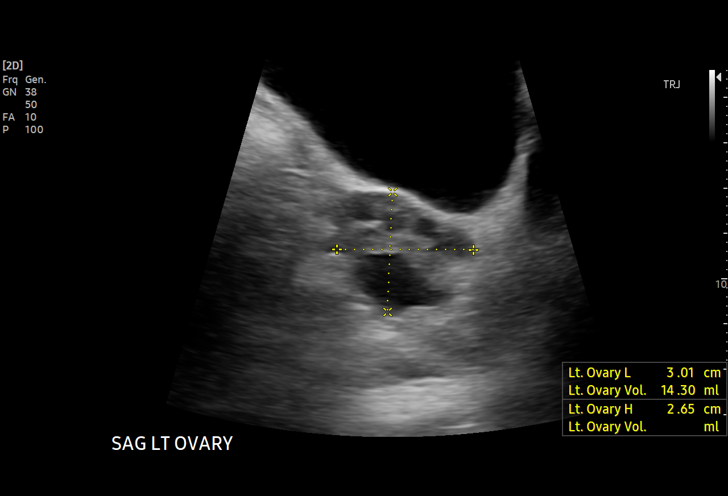
[im 34/89]
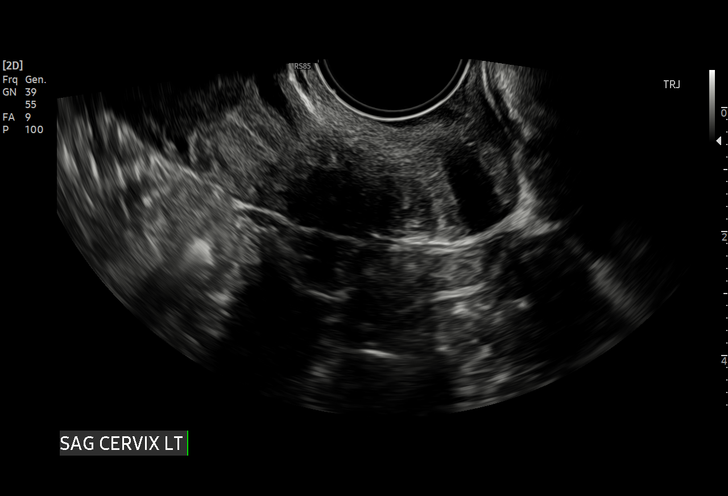
[im 37/89]
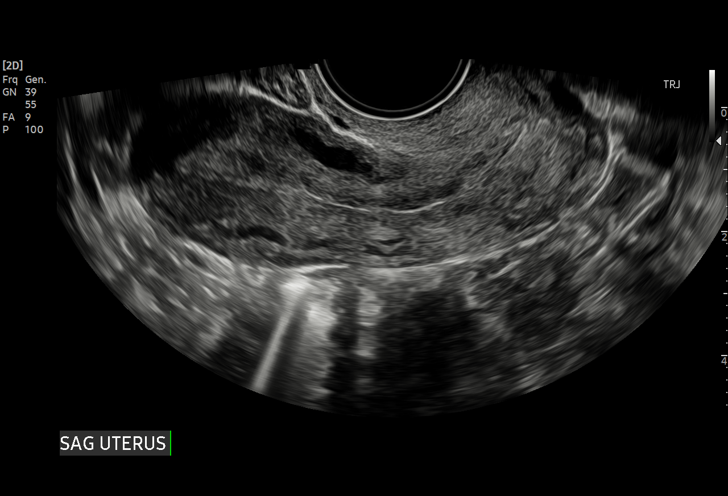
[im 45/89]
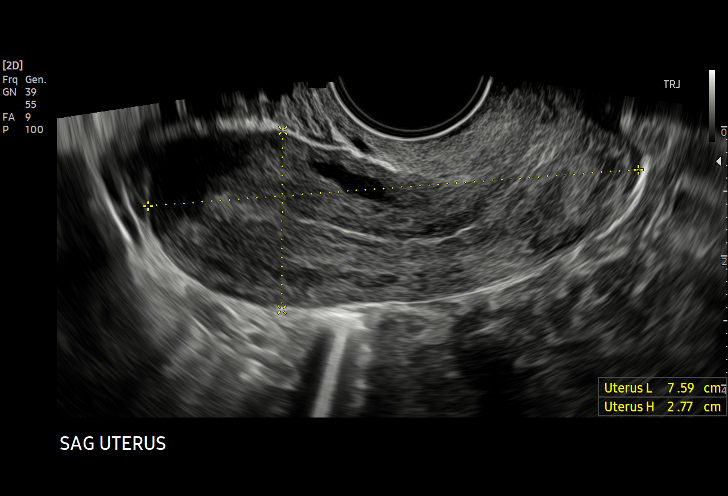
[im 52/89]
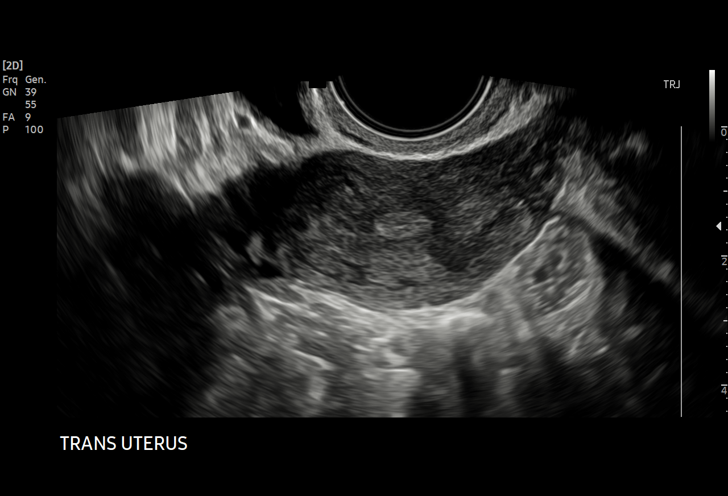
[im 56/89]
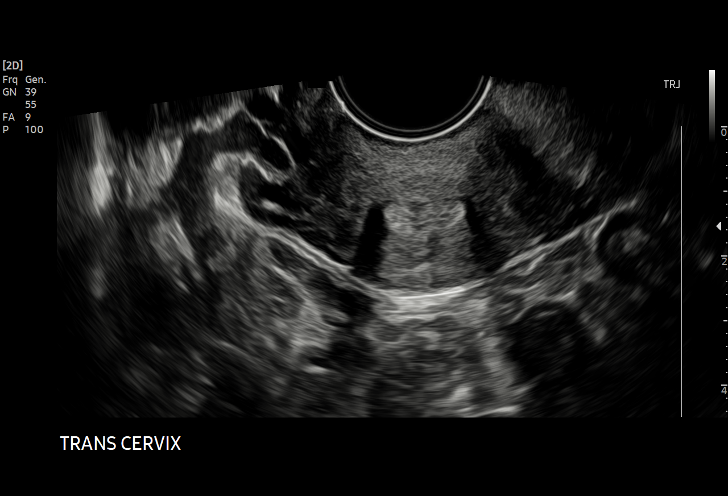
[im 63/89]
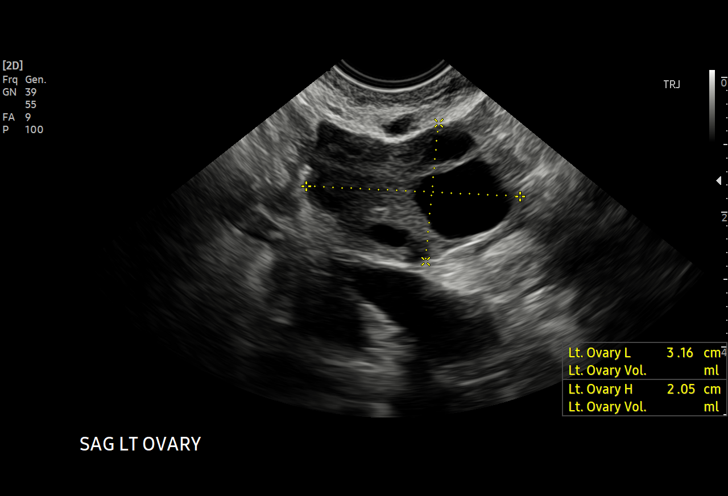
[im 70/89]
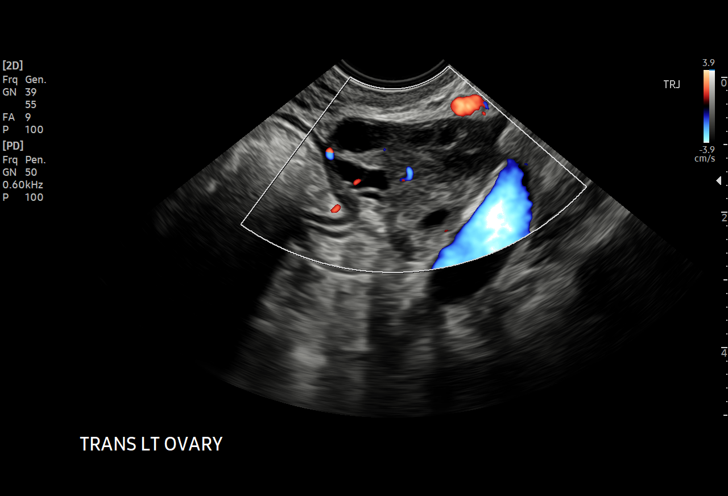
[im 74/89]
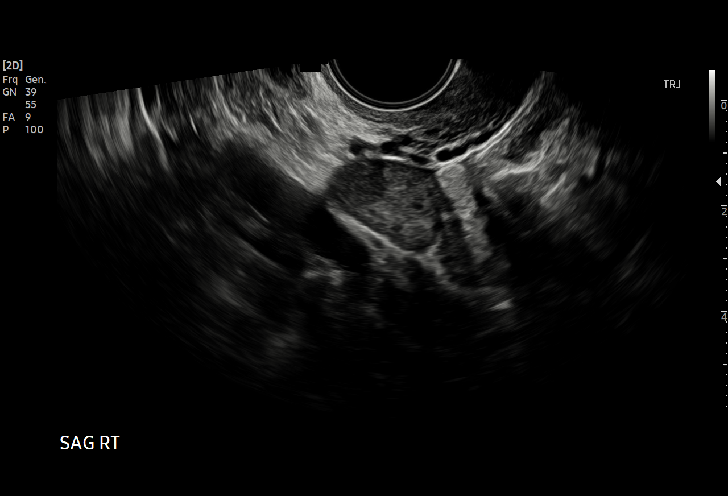
[im 81/89]
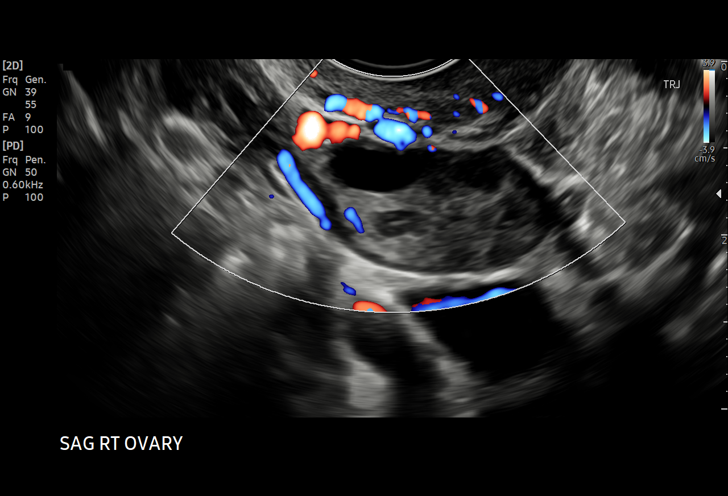
[im 89/89]
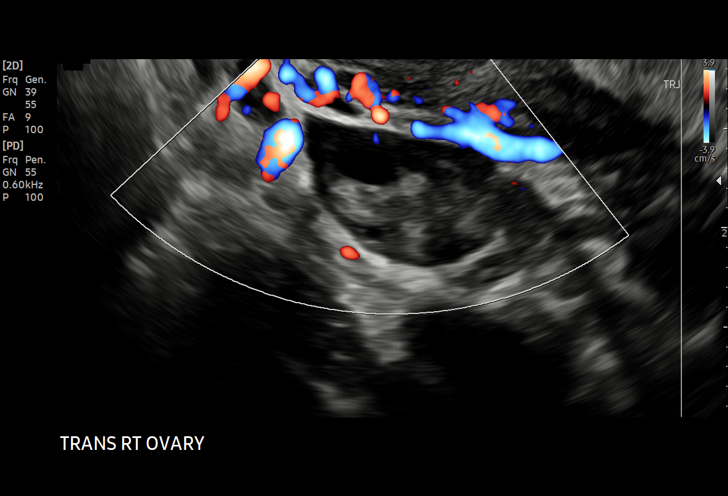

[15 of 25 positions shown; findings below may reference images not displayed]

FINDINGS: Uterus

Measurements: 7.6 x 2.8 x 4.6 cm = volume: 50 mL. Anteverted. Normal
morphology without mass

Endometrium

Thickness: 6 mm.  No endometrial fluid or focal abnormality

Right ovary

Measurements: 2.6 x 1.5 x 2.2 cm = volume: 4.6 mL. Normal morphology
without mass

Left ovary

Measurements: 3.2 x 2.1 x 2.6 cm = volume: 8.9 mL. Normal morphology
without mass

Other findings

Trace free pelvic fluid.  No adnexal masses.
IMPRESSION: Normal exam.
